# Patient Record
Sex: Female | Born: 1985 | Race: White | Hispanic: No | Smoking: Current every day smoker
Health system: Southern US, Community
[De-identification: ages and names within clinical notes are randomized; demographics above are authoritative.]

## PROBLEM LIST (undated history)

## (undated) DIAGNOSIS — J45909 Unspecified asthma, uncomplicated: Secondary | ICD-10-CM

## (undated) DIAGNOSIS — N200 Calculus of kidney: Secondary | ICD-10-CM

## (undated) DIAGNOSIS — Z87442 Personal history of urinary calculi: Secondary | ICD-10-CM

## (undated) DIAGNOSIS — E161 Other hypoglycemia: Secondary | ICD-10-CM

## (undated) DIAGNOSIS — Z8711 Personal history of peptic ulcer disease: Secondary | ICD-10-CM

## (undated) DIAGNOSIS — R51 Headache: Secondary | ICD-10-CM

## (undated) DIAGNOSIS — M1990 Unspecified osteoarthritis, unspecified site: Secondary | ICD-10-CM

## (undated) DIAGNOSIS — R519 Headache, unspecified: Secondary | ICD-10-CM

## (undated) DIAGNOSIS — Z8719 Personal history of other diseases of the digestive system: Secondary | ICD-10-CM

## (undated) DIAGNOSIS — F319 Bipolar disorder, unspecified: Secondary | ICD-10-CM

## (undated) DIAGNOSIS — K219 Gastro-esophageal reflux disease without esophagitis: Secondary | ICD-10-CM

## (undated) HISTORY — PX: COLONOSCOPY: SHX174

## (undated) HISTORY — PX: ESOPHAGOGASTRODUODENOSCOPY: SHX1529

## (undated) HISTORY — PX: OTHER SURGICAL HISTORY: SHX169

## (undated) HISTORY — PX: CHOLECYSTECTOMY: SHX55

## (undated) HISTORY — PX: ABDOMINAL HYSTERECTOMY: SHX81

## (undated) HISTORY — PX: MOUTH SURGERY: SHX715

---

## 2015-01-25 ENCOUNTER — Emergency Department: Payer: Self-pay | Admitting: Emergency Medicine

## 2015-02-07 DIAGNOSIS — F319 Bipolar disorder, unspecified: Secondary | ICD-10-CM | POA: Insufficient documentation

## 2015-02-07 DIAGNOSIS — F17200 Nicotine dependence, unspecified, uncomplicated: Secondary | ICD-10-CM | POA: Insufficient documentation

## 2015-03-19 ENCOUNTER — Emergency Department
Admit: 2015-03-19 | Disposition: A | Discharge status: Home / Self Care | Payer: Self-pay | Admitting: Emergency Medicine

## 2015-05-09 DIAGNOSIS — G43909 Migraine, unspecified, not intractable, without status migrainosus: Secondary | ICD-10-CM | POA: Insufficient documentation

## 2015-08-03 DIAGNOSIS — K219 Gastro-esophageal reflux disease without esophagitis: Secondary | ICD-10-CM | POA: Insufficient documentation

## 2015-08-17 DIAGNOSIS — Z8711 Personal history of peptic ulcer disease: Secondary | ICD-10-CM | POA: Insufficient documentation

## 2015-11-09 ENCOUNTER — Encounter: Payer: Self-pay | Admitting: *Deleted

## 2015-11-09 ENCOUNTER — Emergency Department: Payer: BLUE CROSS/BLUE SHIELD

## 2015-11-09 ENCOUNTER — Emergency Department
Admission: EM | Admit: 2015-11-09 | Discharge: 2015-11-09 | Disposition: A | Discharge status: Home / Self Care | Payer: BLUE CROSS/BLUE SHIELD | Attending: Emergency Medicine | Admitting: Emergency Medicine

## 2015-11-09 DIAGNOSIS — R109 Unspecified abdominal pain: Secondary | ICD-10-CM | POA: Diagnosis present

## 2015-11-09 DIAGNOSIS — Z3202 Encounter for pregnancy test, result negative: Secondary | ICD-10-CM | POA: Diagnosis not present

## 2015-11-09 DIAGNOSIS — N368 Other specified disorders of urethra: Secondary | ICD-10-CM | POA: Diagnosis not present

## 2015-11-09 DIAGNOSIS — F17200 Nicotine dependence, unspecified, uncomplicated: Secondary | ICD-10-CM | POA: Insufficient documentation

## 2015-11-09 DIAGNOSIS — N23 Unspecified renal colic: Secondary | ICD-10-CM

## 2015-11-09 DIAGNOSIS — Z880 Allergy status to penicillin: Secondary | ICD-10-CM | POA: Insufficient documentation

## 2015-11-09 HISTORY — DX: Calculus of kidney: N20.0

## 2015-11-09 LAB — COMPREHENSIVE METABOLIC PANEL
ALK PHOS: 69 U/L (ref 38–126)
ALT: 28 U/L (ref 14–54)
AST: 23 U/L (ref 15–41)
Albumin: 4.4 g/dL (ref 3.5–5.0)
Anion gap: 7 (ref 5–15)
BUN: 6 mg/dL (ref 6–20)
CALCIUM: 9.3 mg/dL (ref 8.9–10.3)
CHLORIDE: 105 mmol/L (ref 101–111)
CO2: 27 mmol/L (ref 22–32)
CREATININE: 0.75 mg/dL (ref 0.44–1.00)
GFR calc Af Amer: >60 mL/min (ref >60)
GFR calc non Af Amer: >60 mL/min (ref >60)
GLUCOSE: 100 mg/dL — AB (ref 65–99)
Potassium: 3.7 mmol/L (ref 3.5–5.1)
Sodium: 139 mmol/L (ref 135–145)
Total Bilirubin: 0.3 mg/dL (ref 0.3–1.2)
Total Protein: 7.6 g/dL (ref 6.5–8.1)

## 2015-11-09 LAB — CBC WITH DIFFERENTIAL/PLATELET
Basophils Absolute: 0.1 10*3/uL (ref 0–0.1)
Basophils Relative: 1 %
EOS ABS: 0.2 10*3/uL (ref 0–0.7)
EOS PCT: 2 %
HCT: 42 % (ref 35.0–47.0)
Hemoglobin: 14 g/dL (ref 12.0–16.0)
LYMPHS ABS: 3.2 10*3/uL (ref 1.0–3.6)
Lymphocytes Relative: 25 %
MCH: 31.8 pg (ref 26.0–34.0)
MCHC: 33.3 g/dL (ref 32.0–36.0)
MCV: 95.3 fL (ref 80.0–100.0)
MONO ABS: 0.7 10*3/uL (ref 0.2–0.9)
Monocytes Relative: 6 %
Neutro Abs: 8.6 10*3/uL — ABNORMAL HIGH (ref 1.4–6.5)
Neutrophils Relative %: 66 %
PLATELETS: 331 10*3/uL (ref 150–440)
RBC: 4.4 MIL/uL (ref 3.80–5.20)
RDW: 14.4 % (ref 11.5–14.5)
WBC: 12.7 10*3/uL — ABNORMAL HIGH (ref 3.6–11.0)

## 2015-11-09 LAB — LIPASE, BLOOD: Lipase: 19 U/L (ref 11–51)

## 2015-11-09 LAB — URINALYSIS COMPLETE WITH MICROSCOPIC (ARMC ONLY)
BACTERIA UA: NONE SEEN
BILIRUBIN URINE: NEGATIVE
Glucose, UA: NEGATIVE mg/dL
Ketones, ur: NEGATIVE mg/dL
LEUKOCYTES UA: NEGATIVE
Nitrite: NEGATIVE
PH: 7 (ref 5.0–8.0)
Protein, ur: NEGATIVE mg/dL
Specific Gravity, Urine: 1.002 — ABNORMAL LOW (ref 1.005–1.030)
WBC, UA: NONE SEEN WBC/hpf (ref 0–5)

## 2015-11-09 LAB — POCT PREGNANCY, URINE: Preg Test, Ur: NEGATIVE

## 2015-11-09 MED ORDER — OXYCODONE-ACETAMINOPHEN 5-325 MG PO TABS: 1.0000 | ORAL_TABLET | ORAL | Status: DC | PRN

## 2015-11-09 MED ORDER — ONDANSETRON HCL 4 MG/2ML IJ SOLN
4.0000 mg | Freq: Once | INTRAMUSCULAR | Status: AC
  Administered 2015-11-09: 4 mg via INTRAVENOUS
  Filled 2015-11-09: qty 2

## 2015-11-09 MED ORDER — TAMSULOSIN HCL 0.4 MG PO CAPS: 0.4000 mg | ORAL_CAPSULE | Freq: Every day | ORAL | Status: DC

## 2015-11-09 MED ORDER — KETOROLAC TROMETHAMINE 30 MG/ML IJ SOLN
30.0000 mg | Freq: Once | INTRAMUSCULAR | Status: AC
  Administered 2015-11-09: 30 mg via INTRAVENOUS
  Filled 2015-11-09: qty 1

## 2015-11-09 MED ORDER — SODIUM CHLORIDE 0.9 % IV BOLUS (SEPSIS)
1000.0000 mL | Freq: Once | INTRAVENOUS | Status: AC
  Administered 2015-11-09: 1000 mL via INTRAVENOUS

## 2015-11-09 MED ORDER — ONDANSETRON HCL 4 MG PO TABS: 4.0000 mg | ORAL_TABLET | Freq: Three times a day (TID) | ORAL | Status: DC | PRN

## 2015-11-09 MED ORDER — HYDROMORPHONE HCL 1 MG/ML IJ SOLN: 1.0000 mg | Freq: Once | INTRAMUSCULAR | Status: DC

## 2015-11-09 MED ORDER — KETOROLAC TROMETHAMINE 10 MG PO TABS: 10.0000 mg | ORAL_TABLET | Freq: Three times a day (TID) | ORAL | Status: DC | PRN

## 2015-11-09 NOTE — ED Notes (Signed)
Visitor noted to be standing in the hallway states they are ready to go home, this RN informed them i would be in as soon as possible to take care of them but it would be a few minutes, asked visitor to step back into the room for privacy reasons. Visitor then took one step back but remained leaning into the hall again asked pt to please step back into the room due to keep pts information confidential. Visitor looks at feet and says he is technically not in the hall and that we should not discuss pts information in the hall. Visitor was informed that if he was unable to step back into the room security would need to be called. Visitor finally did back into the room. Charge Nurse called to please assist with situation.

## 2015-11-09 NOTE — ED Notes (Signed)
Patient transported to Ultrasound 

## 2015-11-09 NOTE — ED Provider Notes (Signed)
[facility] Emergency Department Provider Note   ____________________________________________  Time seen: 3:50 PM I have reviewed the triage vital signs and the triage nursing note.  HISTORY  Chief Complaint Flank Pain   Historian Patient  HPI Olivia Walter is a 29 y.o. female with a history of prior kidney stones, passed the last one about 3 years ago, has had left flank pain for a day or 2 that wraps around to the left abdomen, worse today. Pain at worst is 10 out of 10, currently is 8 out of 10. Feels sharp. She's had some bloody urine. She's had some frequency of urination. She's been drinking cranberry juice since yesterday. No fever. Some nausea without vomiting or diarrhea. No exacerbating or alleviating factors.   Past Medical History  Diagnosis Date  . Kidney calculus     There are no active problems to display for this patient.   History reviewed. No pertinent past surgical history.  Current Outpatient Rx  Name  Route  Sig  Dispense  Refill  . ketorolac (TORADOL) 10 MG tablet   Oral   Take 1 tablet (10 mg total) by mouth every 8 (eight) hours as needed for moderate pain.   15 tablet   0   . ondansetron (ZOFRAN) 4 MG tablet   Oral   Take 1 tablet (4 mg total) by mouth every 8 (eight) hours as needed for nausea or vomiting.   10 tablet   0   . oxyCODONE-acetaminophen (ROXICET) 5-325 MG tablet   Oral   Take 1 tablet by mouth every 4 (four) hours as needed for severe pain.   10 tablet   0   . tamsulosin (FLOMAX) 0.4 MG CAPS capsule   Oral   Take 1 capsule (0.4 mg total) by mouth daily.   7 capsule   0     Allergies Bactrim; Clindamycin/lincomycin; and Penicillins  History reviewed. No pertinent family history.  Social History Social History  Substance Use Topics  . Smoking status: Current Every Day Smoker  . Smokeless tobacco: None  . Alcohol Use: None    Review of Systems  Constitutional: Negative for  fever. Eyes: Negative for visual changes. ENT: Negative for sore throat. Cardiovascular: Negative for chest pain. Respiratory: Negative for shortness of breath. Gastrointestinal: Negative for vomiting and diarrhea. Genitourinary: Mild dysuria. No pelvic discharge, pelvic pain, or irregular vaginal bleeding. Musculoskeletal: Negative for back pain. Skin: Negative for rash. Neurological: Negative for headache. 10 point Review of Systems otherwise negative ____________________________________________   PHYSICAL EXAM:  VITAL SIGNS: ED Triage Vitals  Enc Vitals Group     BP 11/09/15 1544 114/72 mmHg     Pulse Rate 11/09/15 1544 93     Resp 11/09/15 1544 18     Temp 11/09/15 1544 98.1 F (36.7 C)     Temp Source 11/09/15 1544 Oral     SpO2 11/09/15 1544 99 %     Weight 11/09/15 1544 160 lb (72.576 kg)     Height 11/09/15 1544  (1.6 m)     Head Cir --      Peak Flow --      Pain Score 11/09/15 1544 8     Pain Loc --      Pain Edu? --      Excl. in GC? --      Constitutional: Alert and oriented. Well appearing and in no distress. Eyes: Conjunctivae are normal. PERRL. Normal extraocular movements. ENT   Head: Normocephalic and  atraumatic.   Nose: No congestion/rhinnorhea.   Mouth/Throat: Mucous membranes are moist.   Neck: No stridor. Cardiovascular/Chest: Normal rate, regular rhythm.  No murmurs, rubs, or gallops. Respiratory: Normal respiratory effort without tachypnea nor retractions. Breath sounds are clear and equal bilaterally. No wheezes/rales/rhonchi. Gastrointestinal: Soft. No distention, no guarding, no rebound. Nontender   Genitourinary/rectal:Deferred Musculoskeletal: Nontender with normal range of motion in all extremities. No joint effusions.  No lower extremity tenderness.  No edema. Neurologic:  Normal speech and language. No gross or focal neurologic deficits are appreciated. Skin:  Skin is warm, dry and intact. No rash noted. Psychiatric:  Mood and affect are normal. Speech and behavior are normal. Patient exhibits appropriate insight and judgment.  ____________________________________________   EKG I, Governor Rooksebecca Mekala Winger, MD, the attending physician have personally viewed and interpreted all ECGs.  None ____________________________________________  LABS (pertinent positives/negatives)  Urine pregnancy test negative Comprehensive metabolic panel without significant abnormalities White blood cell count 12.7 Urinalysis hemoglobin urine dipstick 1+, 0-5 red blood cells and otherwise negative  ____________________________________________  RADIOLOGY All Xrays were viewed by me. Imaging interpreted by Radiologist.  Renal ultrasound: Negative __________________________________________  PROCEDURES  Procedure(s) performed: None  Critical Care performed: None  ____________________________________________   ED COURSE / ASSESSMENT AND PLAN  CONSULTATIONS: None  Pertinent labs & imaging results that were available during my care of the patient were reviewed by me and considered in my medical decision making (see chart for details).  Suspect nephrolithiasis based on clinical history. Initiated symptomatic treatment. Discussed with the patient that we will try to avoid CT scan due to radiation risk.  Minimal urine blood, but certainly no evidence of infection. Renal ultrasound shows no evidence of obstruction. Renal function is normal. She does have a minimally elevated white blood cell count, however patient does occur symptoms are due to any stone, and she's not having other symptoms to make me highly concerned for another intra-abdominal source of her left flank pain with hematuria. She is going be treated presumptively for nephrolithiasis.    Patient / Family / Caregiver informed of clinical course, medical decision-making process, and agree with plan.   I discussed return precautions, follow-up instructions, and  discharged instructions with patient and/or family.  ___________________________________________   FINAL CLINICAL IMPRESSION(S) / ED DIAGNOSES   Final diagnoses:  Ureteral colic       Governor Rooksebecca Sandra Tellefsen, MD 11/09/15 [phone]

## 2015-11-09 NOTE — ED Notes (Signed)
Pt states she has a hx of kidney stones. Started to experience pain about a week ago. Currently rates pain at 9.

## 2015-11-09 NOTE — Discharge Instructions (Signed)
You were evaluated for left flank pain, and based on your symptoms, it sounds like you are having pain associated with passing a kidney stone. He did examine evaluation are reassuring. We discussed your White blood cell count was slightly elevated, and we discussed holding off on CT scan at this point in time and treating for presumed kidney stone.  Return to the emergency department for any worsening condition including any worsening pain, inability to urinate, fever, black or bloody stools, vomiting, or any other symptoms concerning to you.   Renal Colic Renal colic is pain that is caused by passing a kidney stone. The pain can be sharp and severe. It may be felt in the back, abdomen, side (flank), or groin. It can cause nausea. Renal colic can come and go. HOME CARE INSTRUCTIONS Watch your condition for any changes. The following actions may help to lessen any discomfort that you are feeling:  Take medicines only as directed by your health care provider.  Ask your health care provider if it is okay to take over-the-counter pain medicine.  Drink enough fluid to keep your urine clear or pale yellow. Drink 6-8 glasses of water each day.  Limit the amount of salt that you eat to less than 2 grams per day.  Reduce the amount of protein in your diet. Eat less meat, fish, nuts, and dairy.  Avoid foods such as spinach, rhubarb, nuts, or bran. These may make kidney stones more likely to form. SEEK MEDICAL CARE IF:  You have a fever or chills.  Your urine smells bad or looks cloudy.  You have pain or burning when you pass urine. SEEK IMMEDIATE MEDICAL CARE IF:  Your flank pain or groin pain suddenly worsens.  You become confused or disoriented or you lose consciousness.   This information is not intended to replace advice given to you by your health care provider. Make sure you discuss any questions you have with your health care provider.   Document Released: 08/22/2005 Document  Revised: 12/03/2014 Document Reviewed: 09/22/2014 Elsevier Interactive Patient Education [employer]2016 [employer] Inc.

## 2015-11-09 NOTE — ED Notes (Signed)
Visitor in hall upset; requesting to leave. [facility] orders in for medications; orders have not been carried. Visitor states, "We just want to go." MD aware and is ok with patient leaving prior to medication administration.

## 2015-11-09 NOTE — ED Notes (Signed)
Charge nurse called to room to speak with patient visitor who was upset regarding discharge. Visitor reporting that they had been waiting "for over an hour" for medications and discharge. Of note, primary nurse tied up in another room with a critical patient. MD asked this RN to discharge patient; delayed as chart was not in discharge rack - primary nurse had chart. RN to room; PIV removed. Charge nurse apologized for delay. Visitor states, "She has medications on there that she did not receive. I do not want them showing up on her insurance." This RN explained to patient and visitor that they would not be charged for any services or medications that they did not receive tonight. Visitor asking for primary nurse's name prior to leaving ED. Patient discharge and follow up information reviewed with patient by ED nursing staff and patient given the opportunity to ask questions pertaining to ED visit and discharge plan of care. Patient advised that should symptoms not continue to improve, resolve entirely, or should new symptoms develop then a follow up visit with their PCP or a return visit to the ED may be warranted. Patient verbalized consent and understanding of discharge plan of care including potential need for further evaluation. Patient being discharged in stable condition per attending ED physician on duty.

## 2015-11-09 NOTE — ED Notes (Signed)
Pt states left sided flank pain, states some blood in her urine, states hx of kidney stones

## 2016-06-05 ENCOUNTER — Encounter: Payer: Self-pay | Admitting: Emergency Medicine

## 2016-06-05 ENCOUNTER — Emergency Department: Payer: BLUE CROSS/BLUE SHIELD

## 2016-06-05 ENCOUNTER — Emergency Department
Admission: EM | Admit: 2016-06-05 | Discharge: 2016-06-05 | Disposition: A | Discharge status: Home / Self Care | Payer: BLUE CROSS/BLUE SHIELD | Attending: Emergency Medicine | Admitting: Emergency Medicine

## 2016-06-05 DIAGNOSIS — F17200 Nicotine dependence, unspecified, uncomplicated: Secondary | ICD-10-CM | POA: Diagnosis not present

## 2016-06-05 DIAGNOSIS — Z791 Long term (current) use of non-steroidal anti-inflammatories (NSAID): Secondary | ICD-10-CM | POA: Insufficient documentation

## 2016-06-05 DIAGNOSIS — R112 Nausea with vomiting, unspecified: Secondary | ICD-10-CM | POA: Diagnosis not present

## 2016-06-05 DIAGNOSIS — J45909 Unspecified asthma, uncomplicated: Secondary | ICD-10-CM | POA: Insufficient documentation

## 2016-06-05 DIAGNOSIS — Z79899 Other long term (current) drug therapy: Secondary | ICD-10-CM | POA: Insufficient documentation

## 2016-06-05 DIAGNOSIS — R1012 Left upper quadrant pain: Secondary | ICD-10-CM | POA: Diagnosis present

## 2016-06-05 DIAGNOSIS — E876 Hypokalemia: Secondary | ICD-10-CM | POA: Insufficient documentation

## 2016-06-05 HISTORY — DX: Unspecified asthma, uncomplicated: J45.909

## 2016-06-05 LAB — COMPREHENSIVE METABOLIC PANEL
ALBUMIN: 4.4 g/dL (ref 3.5–5.0)
ALK PHOS: 70 U/L (ref 38–126)
ALT: 54 U/L (ref 14–54)
ANION GAP: 10 (ref 5–15)
AST: 32 U/L (ref 15–41)
BUN: 6 mg/dL (ref 6–20)
CALCIUM: 9.1 mg/dL (ref 8.9–10.3)
CO2: 23 mmol/L (ref 22–32)
Chloride: 104 mmol/L (ref 101–111)
Creatinine, Ser: 0.71 mg/dL (ref 0.44–1.00)
GFR calc non Af Amer: >60 mL/min (ref >60)
GLUCOSE: 102 mg/dL — AB (ref 65–99)
POTASSIUM: 2.9 mmol/L — AB (ref 3.5–5.1)
Sodium: 137 mmol/L (ref 135–145)
TOTAL PROTEIN: 7.4 g/dL (ref 6.5–8.1)
Total Bilirubin: 0.3 mg/dL (ref 0.3–1.2)

## 2016-06-05 LAB — URINALYSIS COMPLETE WITH MICROSCOPIC (ARMC ONLY)
BACTERIA UA: NONE SEEN
Bilirubin Urine: NEGATIVE
Glucose, UA: NEGATIVE mg/dL
KETONES UR: NEGATIVE mg/dL
LEUKOCYTES UA: NEGATIVE
NITRITE: NEGATIVE
PROTEIN: NEGATIVE mg/dL
SPECIFIC GRAVITY, URINE: 1.003 — AB (ref 1.005–1.030)
pH: 7 (ref 5.0–8.0)

## 2016-06-05 LAB — LIPASE, BLOOD: Lipase: 17 U/L (ref 11–51)

## 2016-06-05 LAB — CBC
HEMATOCRIT: 43.4 % (ref 35.0–47.0)
HEMOGLOBIN: 15.2 g/dL (ref 12.0–16.0)
MCH: 32.9 pg (ref 26.0–34.0)
MCHC: 35 g/dL (ref 32.0–36.0)
MCV: 94 fL (ref 80.0–100.0)
Platelets: 381 10*3/uL (ref 150–440)
RBC: 4.62 MIL/uL (ref 3.80–5.20)
RDW: 14.4 % (ref 11.5–14.5)
WBC: 9.8 10*3/uL (ref 3.6–11.0)

## 2016-06-05 LAB — PREGNANCY, URINE: PREG TEST UR: NEGATIVE

## 2016-06-05 MED ORDER — IOPAMIDOL (ISOVUE-300) INJECTION 61%
100.0000 mL | Freq: Once | INTRAVENOUS | Status: AC | PRN
  Administered 2016-06-05: 100 mL via INTRAVENOUS
  Filled 2016-06-05: qty 100

## 2016-06-05 MED ORDER — FENTANYL CITRATE (PF) 100 MCG/2ML IJ SOLN
50.0000 ug | Freq: Once | INTRAMUSCULAR | Status: AC
  Administered 2016-06-05: 50 ug via INTRAVENOUS
  Filled 2016-06-05: qty 2

## 2016-06-05 MED ORDER — POTASSIUM CHLORIDE ER 20 MEQ PO TBCR: 40.0000 meq | EXTENDED_RELEASE_TABLET | Freq: Every day | ORAL | Status: DC

## 2016-06-05 MED ORDER — ONDANSETRON HCL 4 MG/2ML IJ SOLN
4.0000 mg | Freq: Once | INTRAMUSCULAR | Status: AC
  Administered 2016-06-05: 4 mg via INTRAVENOUS
  Filled 2016-06-05: qty 2

## 2016-06-05 MED ORDER — ONDANSETRON 4 MG PO TBDP: 4.0000 mg | ORAL_TABLET | Freq: Three times a day (TID) | ORAL | Status: DC | PRN

## 2016-06-05 MED ORDER — DIATRIZOATE MEGLUMINE & SODIUM 66-10 % PO SOLN
15.0000 mL | Freq: Once | ORAL | Status: AC
  Administered 2016-06-05: 15 mL via ORAL

## 2016-06-05 MED ORDER — POTASSIUM CHLORIDE CRYS ER 20 MEQ PO TBCR
40.0000 meq | EXTENDED_RELEASE_TABLET | Freq: Once | ORAL | Status: AC
  Administered 2016-06-05: 40 meq via ORAL
  Filled 2016-06-05 (×2): qty 2

## 2016-06-05 MED ORDER — ACETAMINOPHEN 500 MG PO TABS
ORAL_TABLET | ORAL | Status: AC
  Administered 2016-06-05: 1000 mg via ORAL
  Filled 2016-06-05: qty 2

## 2016-06-05 MED ORDER — SODIUM CHLORIDE 0.9 % IV BOLUS (SEPSIS)
1000.0000 mL | Freq: Once | INTRAVENOUS | Status: AC
  Administered 2016-06-05: 1000 mL via INTRAVENOUS

## 2016-06-05 MED ORDER — OMEPRAZOLE 40 MG PO CPDR: 40.0000 mg | DELAYED_RELEASE_CAPSULE | Freq: Every day | ORAL | Status: DC

## 2016-06-05 MED ORDER — ACETAMINOPHEN 500 MG PO TABS
1000.0000 mg | ORAL_TABLET | Freq: Once | ORAL | Status: AC
  Administered 2016-06-05: 1000 mg via ORAL

## 2016-06-05 MED ORDER — GI COCKTAIL ~~LOC~~
30.0000 mL | Freq: Once | ORAL | Status: AC
  Administered 2016-06-05: 30 mL via ORAL
  Filled 2016-06-05: qty 30

## 2016-06-05 NOTE — Discharge Instructions (Signed)
It is possible that your nausea and abdominal pain may be from a peptic ulcer.  Please start the omeprazole today, follow the recommended diet, and stay away from alcohol and NSAID medications (Alleve, Ibuprofen, Advil, Motrin).  Return to the emergency department for severe pain, inability to keep down liquids, fever, or any other symptoms concerning to you.

## 2016-06-05 NOTE — ED Notes (Signed)
Talked to Shay from the lab and got her to add on a urine pregnancy to the urine that was already down in the lab.

## 2016-06-05 NOTE — ED Notes (Signed)
Pt with left upper quadrant pain for two weeks worse the past two days.

## 2016-06-05 NOTE — ED Provider Notes (Addendum)
[facility] Emergency Department Provider Note  ____________________________________________  Time seen: Approximately 2:31 PM  I have reviewed the triage vital signs and the nursing notes.   HISTORY  Chief Complaint Abdominal Pain    HPI Olivia Walter is a [age] y.o. female presenting with 2 weeks of left upper quadrant pain, nausea and vomiting, and mild diarrhea. The patient reports that for the past 2 weeks she has had a left upper quadrant "stabbing" sensation every time she eats. This is not worse with positional changes, it is not worse with deep breaths or coughing, and it has not been associated with any chest pain or shortness of breath. Since yesterday, the pain has become more constant, and now she is unable to tolerate anything by mouth due to pain. She had 1 episode of vomiting 2 days ago. She denies any fever, chills, urinary symptoms. She has had several episodes of loose stool that is nonbloody.The patient does take daily Motrin.   Past Medical History  Diagnosis Date  . Kidney calculus   . Asthma     There are no active problems to display for this patient.   History reviewed. No pertinent past surgical history.  Current Outpatient Rx  Name  Route  Sig  Dispense  Refill  . DEXILANT 60 MG capsule   Oral   Take 1 capsule by mouth daily.           Dispense as written.   . meloxicam (MOBIC) 15 MG tablet   Oral   Take 1 tablet by mouth 2 (two) times daily.         . tamsulosin (FLOMAX) 0.4 MG CAPS capsule   Oral   Take 1 capsule (0.4 mg total) by mouth daily.   7 capsule   0   . omeprazole (PRILOSEC) 40 MG capsule   Oral   Take 1 capsule (40 mg total) by mouth daily.   30 capsule   0   . ondansetron (ZOFRAN ODT) 4 MG disintegrating tablet   Oral   Take 1 tablet (4 mg total) by mouth every 8 (eight) hours as needed for nausea or vomiting.   20 tablet   0   . potassium chloride 20 MEQ TBCR   Oral   Take 40 mEq by mouth  daily.   6 tablet   0     Allergies Bactrim; Clindamycin/lincomycin; and Penicillins  No family history on file.  Social History Social History  Substance Use Topics  . Smoking status: Current Every Day Smoker  . Smokeless tobacco: None  . Alcohol Use: None    Review of Systems Constitutional: No fever/chills.No lightheadedness or syncope. Eyes: No visual changes. ENT: No sore throat. No congestion or rhinorrhea. Cardiovascular: Denies chest pain. Denies palpitations. Respiratory: Denies shortness of breath.  No cough. Gastrointestinal: Positive left upper quadrant abdominal pain .  Positive nausea, positive vomiting.  Positive diarrhea.  No constipation. Genitourinary: Negative for dysuria. Musculoskeletal: Negative for back pain. Skin: Negative for rash. Neurological: Negative for headaches. No focal numbness, tingling or weakness.   10-point ROS otherwise negative.  ____________________________________________   PHYSICAL EXAM:  VITAL SIGNS: ED Triage Vitals  Enc Vitals Group     BP 06/05/16 1255 117/76 mmHg     Pulse Rate 06/05/16 1255 106     Resp 06/05/16 1255 20     Temp 06/05/16 1255 97.7 F (36.5 C)     Temp Source 06/05/16 1255 Oral     SpO2 06/05/16  1255 97 %     Weight --      Height --      Head Cir --      Peak Flow --      Pain Score 06/05/16 1256 10     Pain Loc --      Pain Edu? --      Excl. in GC? --     Constitutional: Alert and oriented. Uncomfortable appearing but in no acute distress. Answers questions appropriately. Eyes: Conjunctivae are normal.  EOMI. No scleral icterus. Head: Atraumatic. Nose: No congestion/rhinnorhea. Mouth/Throat: Mucous membranes are moist.  Neck: No stridor.  Supple.  No meningismus. Cardiovascular: Normal rate, regular rhythm. No murmurs, rubs or gallops.  Respiratory: Normal respiratory effort.  No accessory muscle use or retractions. Lungs CTAB.  No wheezes, rales or ronchi. Gastrointestinal: Soft and  nondistended.  Tender to palpation in the left upper quadrant. No guarding or rebound.  No peritoneal signs. Musculoskeletal: No LE edema.  Neurologic:  A&Ox3.  Speech is clear.  Face and smile are symmetric.  EOMI.  Moves all extremities well. Skin:  Skin is warm, dry and intact. No rash noted. Psychiatric: Mood and affect are normal. Speech and behavior are normal.  Normal judgement.  ____________________________________________   LABS (all labs ordered are listed, but only abnormal results are displayed)  Labs Reviewed  COMPREHENSIVE METABOLIC PANEL - Abnormal; Notable for the following:    Potassium 2.9 (*)    Glucose, Bld 102 (*)    All other components within normal limits  URINALYSIS COMPLETEWITH MICROSCOPIC ([facility] ONLY) - Abnormal; Notable for the following:    Color, Urine STRAW (*)    APPearance CLEAR (*)    Specific Gravity, Urine 1.003 (*)    Hgb urine dipstick 1+ (*)    Squamous Epithelial / LPF 0-5 (*)    All other components within normal limits  LIPASE, BLOOD  CBC  PREGNANCY, URINE   ____________________________________________  EKG  ED ECG REPORT I, Rockne Menghini, the attending physician, personally viewed and interpreted this ECG.   Date: 06/05/2016  EKG Time: 1448  Rate: 85  Rhythm: normal sinus rhythm  Axis: normal  Intervals:none  ST&T Change: nonspecific twave inversion in V1, no ST elevation  ____________________________________________  RADIOLOGY  Ct Abdomen Pelvis W Contrast  06/05/2016  CLINICAL DATA:  Left upper quadrant pain for 2 weeks EXAM: CT ABDOMEN AND PELVIS WITH CONTRAST TECHNIQUE: Multidetector CT imaging of the abdomen and pelvis was performed using the standard protocol following bolus administration of intravenous contrast. CONTRAST:  ISOVUE-300 IOPAMIDOL (ISOVUE-300) INJECTION 61% COMPARISON:  None. FINDINGS: Lower chest:  The lung bases are unremarkable. Hepatobiliary: Mild fatty infiltration of the liver. No focal  hepatic mass. No calcified gallstones are noted within gallbladder. Pancreas: No mass, inflammatory changes, or other significant abnormality. Spleen: Within normal limits in size and appearance. Adrenals/Urinary Tract: No adrenal gland mass. Enhanced kidneys are symmetrical in size. No hydronephrosis or hydroureter. The urinary bladder is unremarkable. Stomach/Bowel: Oral contrast material was given to the patient. No gastric outlet obstruction. No thickened or dilated small bowel loops. No small bowel obstruction. There is no pericecal inflammation. The terminal ileum is unremarkable. Normal appendix. No evidence of colitis or diverticulitis. No colonic obstruction. Contrast material noted within distal sigmoid colon. Vascular/Lymphatic: No aortic aneurysm. No retroperitoneal or mesenteric adenopathy. Reproductive: The uterus and ovaries are unremarkable. No adnexal mass. The uterus is normal size anteflexed. Other: No ascites or free abdominal air. There is no  inguinal adenopathy. Musculoskeletal: No destructive bony lesions are noted. Sagittal images of the spine are unremarkable. No acute fractures are noted. IMPRESSION: 1. There is no evidence of acute inflammatory process within abdomen. 2. Mild fatty infiltration of the liver. 3. No hydronephrosis or hydroureter. 4. Normal appendix.  No pericecal inflammation. 5. No small bowel obstruction. 6. No evidence of colitis or diverticulitis.  No adnexal mass. Electronically Signed   By: Natasha Mead M.D.   On: 06/05/2016 16:06    ____________________________________________   PROCEDURES  Procedure(s) performed: None  Critical Care performed: No ____________________________________________   INITIAL IMPRESSION / ASSESSMENT AND PLAN / ED COURSE  Pertinent labs & imaging results that were available during my care of the patient were reviewed by me and considered in my medical decision making (see chart for details).  30 y.o. female with progressively  worsening left upper quadrant pain, exacerbated by eating, with nausea and vomiting, mild diarrhea. Overall, the patient is uncomfortable appearing and does have tenderness to palpation in the left upper quadrant. Her symptoms are concerning for peptic or gastric ulcer, symptomatic hiatal hernia. We will also get a CT scan to rule out diverticulitis or colitis. The patient has some mild hypokalemia which will supplement. Will initiate symptomatically treatment and reevaluate the patient after her diagnostic workup.  ----------------------------------------- 4:21 PM on 06/05/2016 -----------------------------------------  The patient's work up is reassuring.  Her CT scan does not show any acute abnormalities.  Her labs are reassuring and she has been supplemented for her hypokalemia.  We will plan to initiate treatment for presumptive PUD and have the patient follow up with her PMD.  Return precautions as well as follow up instructions were discussed.   ____________________________________________  FINAL CLINICAL IMPRESSION(S) / ED DIAGNOSES  Final diagnoses:  Hypokalemia  Abdominal pain, left upper quadrant  Non-intractable vomiting with nausea, vomiting of unspecified type      NEW MEDICATIONS STARTED DURING THIS VISIT:  New Prescriptions   OMEPRAZOLE (PRILOSEC) 40 MG CAPSULE    Take 1 capsule (40 mg total) by mouth daily.   ONDANSETRON (ZOFRAN ODT) 4 MG DISINTEGRATING TABLET    Take 1 tablet (4 mg total) by mouth every 8 (eight) hours as needed for nausea or vomiting.   POTASSIUM CHLORIDE 20 MEQ TBCR    Take 40 mEq by mouth daily.     Rockne Menghini, MD 06/05/16 1622  Rockne Menghini, MD 06/05/16 1610

## 2016-06-05 NOTE — ED Notes (Signed)
Pt states the GI cocktail numbed her throat and esophagus, but did not help the left upper quadrant pain.

## 2016-06-05 NOTE — ED Notes (Signed)
Pt given gingerale for PO challenge 

## 2016-06-05 NOTE — ED Notes (Addendum)
Pt to ED today with left upper quadrant pain that started approximately 2 weeks ago and has progressively gotten worse. Pt reports having history of kidney stones but says this feels different.

## 2016-06-05 NOTE — ED Notes (Signed)
Dr. Sharma CovertNorman signed and saw the EKG.

## 2016-06-12 DIAGNOSIS — K279 Peptic ulcer, site unspecified, unspecified as acute or chronic, without hemorrhage or perforation: Secondary | ICD-10-CM | POA: Insufficient documentation

## 2016-06-12 DIAGNOSIS — E876 Hypokalemia: Secondary | ICD-10-CM | POA: Diagnosis present

## 2016-10-01 ENCOUNTER — Ambulatory Visit: Payer: BLUE CROSS/BLUE SHIELD | Admitting: Anesthesiology

## 2016-10-01 ENCOUNTER — Ambulatory Visit
Admission: RE | Admit: 2016-10-01 | Discharge: 2016-10-01 | Disposition: A | Discharge status: Home / Self Care | Payer: BLUE CROSS/BLUE SHIELD | Source: Ambulatory Visit | Attending: Gastroenterology | Admitting: Gastroenterology

## 2016-10-01 ENCOUNTER — Encounter
Admission: RE | Disposition: A | Discharge status: Home / Self Care | Payer: Self-pay | Source: Ambulatory Visit | Attending: Gastroenterology

## 2016-10-01 ENCOUNTER — Encounter: Payer: Self-pay | Admitting: *Deleted

## 2016-10-01 DIAGNOSIS — Z793 Long term (current) use of hormonal contraceptives: Secondary | ICD-10-CM | POA: Diagnosis not present

## 2016-10-01 DIAGNOSIS — F17200 Nicotine dependence, unspecified, uncomplicated: Secondary | ICD-10-CM | POA: Diagnosis not present

## 2016-10-01 DIAGNOSIS — K3189 Other diseases of stomach and duodenum: Secondary | ICD-10-CM | POA: Insufficient documentation

## 2016-10-01 DIAGNOSIS — R1012 Left upper quadrant pain: Secondary | ICD-10-CM | POA: Insufficient documentation

## 2016-10-01 DIAGNOSIS — K210 Gastro-esophageal reflux disease with esophagitis: Secondary | ICD-10-CM | POA: Diagnosis not present

## 2016-10-01 DIAGNOSIS — Z79899 Other long term (current) drug therapy: Secondary | ICD-10-CM | POA: Insufficient documentation

## 2016-10-01 DIAGNOSIS — K228 Other specified diseases of esophagus: Secondary | ICD-10-CM | POA: Insufficient documentation

## 2016-10-01 DIAGNOSIS — K449 Diaphragmatic hernia without obstruction or gangrene: Secondary | ICD-10-CM | POA: Diagnosis not present

## 2016-10-01 HISTORY — PX: ESOPHAGOGASTRODUODENOSCOPY (EGD) WITH PROPOFOL: SHX5813

## 2016-10-01 HISTORY — DX: Calculus of kidney: N20.0

## 2016-10-01 HISTORY — DX: Other hypoglycemia: E16.1

## 2016-10-01 HISTORY — DX: Unspecified osteoarthritis, unspecified site: M19.90

## 2016-10-01 SURGERY — ESOPHAGOGASTRODUODENOSCOPY (EGD) WITH PROPOFOL
Anesthesia: General

## 2016-10-01 MED ORDER — MIDAZOLAM HCL 2 MG/2ML IJ SOLN
INTRAMUSCULAR | Status: DC | PRN
  Administered 2016-10-01: 1 mg via INTRAVENOUS

## 2016-10-01 MED ORDER — PROPOFOL 500 MG/50ML IV EMUL
INTRAVENOUS | Status: DC | PRN
  Administered 2016-10-01: 125 ug/kg/min via INTRAVENOUS

## 2016-10-01 MED ORDER — FENTANYL CITRATE (PF) 100 MCG/2ML IJ SOLN
INTRAMUSCULAR | Status: DC | PRN
  Administered 2016-10-01: 50 ug via INTRAVENOUS

## 2016-10-01 MED ORDER — SODIUM CHLORIDE 0.9 % IV SOLN
INTRAVENOUS | Status: DC
Start: 2016-10-01 — End: 2016-10-01
  Administered 2016-10-01: 1000 mL via INTRAVENOUS

## 2016-10-01 MED ORDER — SODIUM CHLORIDE 0.9 % IV SOLN: INTRAVENOUS | Status: DC

## 2016-10-01 MED ORDER — PROPOFOL 10 MG/ML IV BOLUS
INTRAVENOUS | Status: DC | PRN
  Administered 2016-10-01: 20 mg via INTRAVENOUS
  Administered 2016-10-01: 30 mg via INTRAVENOUS
  Administered 2016-10-01: 20 mg via INTRAVENOUS

## 2016-10-01 NOTE — H&P (Signed)
Outpatient short stay form Pre-procedure 10/01/2016 11:24 AM Olivia DeemMartin U Caroll Weinheimer MD  Primary Physician:  Dr Lanier EnsignMeredith Soles  Reason for visit:  EGD  History of present illness:  Patient is a 30 year old female presenting today with complaints of left upper quadrant discomfort. This should initially began several months ago. She been placed on initially single-dose PPI then twice a day PPI. She was taking regular NSAIDs and was told to avoid these. She continues take an occasional Excedrin indeed the last one was yesterday. Discussed with her that this does increase risk for bleeding. Traversing symptoms however have improved with the twice a day proton pump inhibitor. She takes no other blood thinning agents or aspirin products.    Current Facility-Administered Medications:  .  0.9 %  sodium chloride infusion, , Intravenous, Continuous, Olivia DeemMartin U Desiderio Dolata, MD, Last Rate: 20 mL/hr at 10/01/16 1105, 1,000 mL at 10/01/16 1105 .  0.9 %  sodium chloride infusion, , Intravenous, Continuous, Olivia DeemMartin U Anushri Casalino, MD  Prescriptions Prior to Admission  Medication Sig Dispense Refill Last Dose  . cyclobenzaprine (FLEXERIL) 10 MG tablet Take 10 mg by mouth as needed for muscle spasms.     . medroxyPROGESTERone (DEPO-PROVERA) 150 MG/ML injection Inject 150 mg into the muscle every 3 (three) months.     . Multiple Vitamins-Iron (MULTIVITAMINS WITH IRON) TABS tablet Take 1 tablet by mouth daily.     . [DISCONTINUED] ferrous fumarate-b12-vitamic C-folic acid (TRINSICON / FOLTRIN) capsule Take 1 capsule by mouth 3 (three) times daily after meals.     . [DISCONTINUED] Mag Aspart-Potassium Aspart (RA POTASSIUM/MAGNESIUM PO) Take by mouth daily.     . pantoprazole (PROTONIX) 40 MG tablet Take 40 mg by mouth 2 (two) times daily before a meal.   Completed Course at Unknown time  . [DISCONTINUED] DEXILANT 60 MG capsule Take 1 capsule by mouth daily.   Completed Course at Unknown time  . [DISCONTINUED] meloxicam (MOBIC)  15 MG tablet Take 1 tablet by mouth 2 (two) times daily.   Completed Course at Unknown time  . [DISCONTINUED] omeprazole (PRILOSEC) 40 MG capsule Take 1 capsule (40 mg total) by mouth daily. (Patient not taking: Reported on 09/28/2016) 30 capsule 0 Completed Course at Unknown time  . [DISCONTINUED] ondansetron (ZOFRAN ODT) 4 MG disintegrating tablet Take 1 tablet (4 mg total) by mouth every 8 (eight) hours as needed for nausea or vomiting. (Patient not taking: Reported on 09/28/2016) 20 tablet 0 Completed Course at Unknown time  . [DISCONTINUED] potassium chloride 20 MEQ TBCR Take 40 mEq by mouth daily. (Patient not taking: Reported on 09/28/2016) 6 tablet 0 Completed Course at Unknown time  . [DISCONTINUED] tamsulosin (FLOMAX) 0.4 MG CAPS capsule Take 1 capsule (0.4 mg total) by mouth daily. (Patient not taking: Reported on 09/28/2016) 7 capsule 0 Completed Course at Unknown time     Allergies  Allergen Reactions  . Bactrim [Sulfamethoxazole-Trimethoprim]   . Clindamycin/Lincomycin   . Penicillins      Past Medical History:  Diagnosis Date  . Asthma   . Kidney calculus   . Kidney stones   . Osteoarthritis   . Reactive hypoglycemia     Review of systems:      Physical Exam    Heart and lungs: Regular rate and rhythm without rub or gallop, lungs are bilaterally clear.    HEENT: Normocephalic atraumatic eyes are anicteric    Other:     Pertinant exam for procedure: Soft nontender nondistended bowel sounds positive normoactive.  Planned proceedures: EGD and indicated procedures. I have discussed the risks benefits and complications of procedures to include not limited to bleeding, infection, perforation and the risk of sedation and the patient wishes to proceed.    Olivia DeemMartin U Oliviya Gilkison, MD Gastroenterology 10/01/2016  11:24 AM

## 2016-10-01 NOTE — Anesthesia Preprocedure Evaluation (Signed)
Anesthesia Evaluation  Patient identified by MRN, date of birth, ID band Patient awake    Reviewed: Allergy & Precautions, NPO status , Patient's Chart, lab work & pertinent test results  History of Anesthesia Complications Negative for: history of anesthetic complications  Airway Mallampati: II       Dental  (+) Edentulous Upper   Pulmonary asthma (as a child) , Current Smoker,           Cardiovascular negative cardio ROS       Neuro/Psych negative neurological ROS     GI/Hepatic Neg liver ROS, GERD  Medicated and Poorly Controlled,  Endo/Other  negative endocrine ROS  Renal/GU Renal disease (stones)     Musculoskeletal  (+) Arthritis , Osteoarthritis,    Abdominal   Peds  Hematology negative hematology ROS (+)   Anesthesia Other Findings   Reproductive/Obstetrics                             Anesthesia Physical Anesthesia Plan  ASA: II  Anesthesia Plan: General   Post-op Pain Management:    Induction: Intravenous  Airway Management Planned:   Additional Equipment:   Intra-op Plan:   Post-operative Plan:   Informed Consent: I have reviewed the patients History and Physical, chart, labs and discussed the procedure including the risks, benefits and alternatives for the proposed anesthesia with the patient or authorized representative who has indicated his/her understanding and acceptance.     Plan Discussed with:   Anesthesia Plan Comments:         Anesthesia Quick Evaluation

## 2016-10-01 NOTE — Transfer of Care (Signed)
Immediate Anesthesia Transfer of Care Note  Patient: Olivia LeatherwoodMelanie Arons  Procedure(s) Performed: Procedure(s): ESOPHAGOGASTRODUODENOSCOPY (EGD) WITH PROPOFOL (N/A)  Patient Location: PACU  Anesthesia Type:General  Level of Consciousness: awake and alert   Airway & Oxygen Therapy: Patient Spontanous Breathing and Patient connected to nasal cannula oxygen  Post-op Assessment: Report given to RN and Post -op Vital signs reviewed and stable  Post vital signs: Reviewed and stable  Last Vitals:  Vitals:   10/01/16 1052  BP: (!) 101/56  Pulse: 95  Resp: 14  Temp: 36.7 C    Last Pain:  Vitals:   10/01/16 1052  TempSrc: Tympanic         Complications: No apparent anesthesia complications

## 2016-10-01 NOTE — Op Note (Signed)
[facility] Gastroenterology Patient Name: Olivia LeatherwoodMelanie Banton Procedure Date: 10/01/2016 11:16 AM MRN: [id number] Account #: [id number] Date of Birth: 08/31/1986 Admit Type: Outpatient Age: 30 Room: [facility] ENDO ROOM 1 Gender: Female Note Status: Finalized Procedure:            Upper GI endoscopy Indications:          Abdominal pain in the left upper quadrant Providers:            Christena DeemMartin U. Clydell Alberts, MD Medicines:            Monitored Anesthesia Care Complications:        No immediate complications. Procedure:            Pre-Anesthesia Assessment:                       - ASA Grade Assessment: II - A patient with mild                        systemic disease.                       After obtaining informed consent, the endoscope was                        passed under direct vision. Throughout the procedure,                        the patient's blood pressure, pulse, and oxygen                        saturations were monitored continuously. The Endoscope                        was introduced through the mouth, and advanced to the                        third part of duodenum. The upper GI endoscopy was                        accomplished without difficulty. The patient tolerated                        the procedure well. Findings:      The Z-line was irregular. Biopsies were taken with a cold forceps for       histology.      A small hiatal hernia was found. The Z-line was a variable distance from       incisors; the hiatal hernia was sliding.      The exam of the esophagus was otherwise normal.      Multiple localized, small non-bleeding erosions were found in the       prepyloric region of the stomach. There were no stigmata of recent       bleeding. Biopsies were taken with a cold forceps for histology.      The cardia and gastric fundus were normal on retroflexion.      Diffuse mild mucosal variance characterized by altered texture was found       in the entire  duodenum. Biopsies were taken with a cold forceps for       histology. Impression:           -  Z-line irregular. Biopsied.                       - Small hiatal hernia.                       - Non-bleeding erosive gastropathy. Biopsied.                       - Mucosal variant in the duodenum. Biopsied. Recommendation:       - Continue present medications.                       - Return to GI clinic in 3 weeks. Procedure Code(s):    --- Professional ---                       [phone], Esophagogastroduodenoscopy, flexible, transoral;                        with biopsy, single or multiple CPT copyright 2016 American Medical Association. All rights reserved. The codes documented in this report are preliminary and upon coder review may  be revised to meet current compliance requirements. Christena DeemMartin U Babita Amaker, MD 10/01/2016 11:45:32 AM This report has been signed electronically. Number of Addenda: 0 Note Initiated On: 10/01/2016 11:16 AM      [facility]

## 2016-10-01 NOTE — Anesthesia Postprocedure Evaluation (Signed)
Anesthesia Post Note  Patient: Olivia LeatherwoodMelanie Walter  Procedure(s) Performed: Procedure(s) (LRB): ESOPHAGOGASTRODUODENOSCOPY (EGD) WITH PROPOFOL (N/A)  Patient location during evaluation: Endoscopy Anesthesia Type: General Level of consciousness: awake and alert Pain management: pain level controlled Vital Signs Assessment: post-procedure vital signs reviewed and stable Respiratory status: spontaneous breathing and respiratory function stable Cardiovascular status: stable Anesthetic complications: no    Last Vitals:  Vitals:   10/01/16 1147 10/01/16 1148  BP: 101/70 101/70  Pulse: 82 82  Resp:  12  Temp: (!) 35.9 C (!) 35.9 C    Last Pain:  Vitals:   10/01/16 1148  TempSrc: Tympanic                 Talayla Doyel K

## 2016-10-02 ENCOUNTER — Encounter: Payer: Self-pay | Admitting: Gastroenterology

## 2016-10-02 LAB — SURGICAL PATHOLOGY

## 2017-03-10 NOTE — Progress Notes (Signed)
03/11/2017 11:23 AM   Olivia Walter 02-Apr-1986 [id number]  Referring provider: Alba Cory, MD [address] [place], [place] [address]  Chief Complaint  Patient presents with  . New Patient (Initial Visit)    hematuria referred by  [name]    HPI: Patient is a 32 -year-old Caucasian female who presents today as a referral from their OBGYN, Dr. Dalbert Garnet, for hematuria.    Patient was found to positive dip for hematuria on UA's.   She states she had blood in urine in the past due to stones.   She does not have a prior history of recurrent urinary tract infections, trauma to the genitourinary tract or malignancies of the genitourinary tract.   She does have a family medical history of nephrolithiasis (mother and sister),  But she does not have a family history of  malignancies of the genitourinary tract or hematuria.   Today, she is having symptoms of nocturia x 1.  This is baseline.  Her UA today is positive for blood on dip, but microscopically negative.    She states that she is experiencing suprapubic pain that feels like severe menstrual cramps.   This is been occurring for one month.  BM's makes the pain worse.  Heating pad eases the pain.  No radiation.  The pain can last a couple minutes to a few hours. She has not had abdominal pain or flank pain.  She denies any recent fevers, chills, nausea or vomiting.   She had a contrast study in 2017 and no worrisome GU findings were discovered. I have independently reviewed the films.  She is a former smoker, with a 2 ppd history.  Quit on 11/17/2016.  She is not exposed to secondhand smoke.  She does not work with chemicals.  She has a high BMI.    She is experiencing constipation.    She states her sister and mother are hypochondriacs.     PMH: Past Medical History:  Diagnosis Date  . Asthma   . Kidney calculus   . Kidney stones   . Osteoarthritis   . Reactive hypoglycemia     Surgical  History: Past Surgical History:  Procedure Laterality Date  . CAPSULE ENDOSCOPY    . COLONOSCOPY    . cyst removed on wrist    . ESOPHAGOGASTRODUODENOSCOPY    . ESOPHAGOGASTRODUODENOSCOPY (EGD) WITH PROPOFOL N/A 10/01/2016   Procedure: ESOPHAGOGASTRODUODENOSCOPY (EGD) WITH PROPOFOL;  Surgeon: Christena Deem, MD;  Location: [facility] ENDOSCOPY;  Service: Endoscopy;  Laterality: N/A;  . MOUTH SURGERY      Home Medications:  Allergies as of 03/11/2017      Reactions   Bactrim [sulfamethoxazole-trimethoprim]    Clindamycin/lincomycin    Penicillins       Medication List       Accurate as of 03/11/17 11:23 AM. Always use your most recent med list.          aspirin-acetaminophen-caffeine 250-250-65 MG tablet Commonly known as:  EXCEDRIN MIGRAINE Take by mouth every 6 (six) hours as needed for headache.   cyclobenzaprine 10 MG tablet Commonly known as:  FLEXERIL Take 10 mg by mouth as needed for muscle spasms.   ibuprofen 800 MG tablet Commonly known as:  ADVIL,MOTRIN Take 800 mg by mouth every 8 (eight) hours as needed.   MAGNESIUM-POTASSIUM PO Take by mouth.   medroxyPROGESTERone 150 MG/ML injection Commonly known as:  DEPO-PROVERA Inject 150 mg into the muscle every 3 (three) months.   multivitamins with  iron Tabs tablet Take 1 tablet by mouth daily.   ONE-A-DAY 55 PLUS PO Take by mouth.   pantoprazole 40 MG tablet Commonly known as:  PROTONIX Take 40 mg by mouth 2 (two) times daily before a meal.       Allergies:  Allergies  Allergen Reactions  . Bactrim [Sulfamethoxazole-Trimethoprim]   . Clindamycin/Lincomycin   . Penicillins     Family History: Family History  Problem Relation Age of Onset  . Prostate cancer Neg Hx   . Kidney cancer Neg Hx   . Bladder Cancer Neg Hx     Social History:  reports that she has quit smoking. She has never used smokeless tobacco. She reports that she does not drink alcohol or use drugs.  ROS: UROLOGY Frequent  Urination?: No Hard to postpone urination?: No Burning/pain with urination?: No Get up at night to urinate?: Yes Leakage of urine?: No Urine stream starts and stops?: No Trouble starting stream?: No Do you have to strain to urinate?: No Blood in urine?: Yes Urinary tract infection?: No Sexually transmitted disease?: No Injury to kidneys or bladder?: No Painful intercourse?: No Weak stream?: No Currently pregnant?: No Vaginal bleeding?: Yes Last menstrual period?: on depo  Gastrointestinal Nausea?: No Vomiting?: No Indigestion/heartburn?: Yes Diarrhea?: Yes Constipation?: Yes  Constitutional Fever: No Night sweats?: No Weight loss?: No Fatigue?: Yes  Skin Skin rash/lesions?: No Itching?: Yes  Eyes Blurred vision?: No Double vision?: No  Ears/Nose/Throat Sore throat?: No Sinus problems?: No  Hematologic/Lymphatic Swollen glands?: No Easy bruising?: Yes  Cardiovascular Leg swelling?: No Chest pain?: No  Respiratory Cough?: No Shortness of breath?: No  Endocrine Excessive thirst?: No  Musculoskeletal Back pain?: No Joint pain?: Yes  Neurological Headaches?: Yes Dizziness?: No  Psychologic Depression?: No Anxiety?: No  Physical Exam: BP 119/86   Pulse (!) 116   Ht  (1.6 m)   Wt 180 lb 11.2 oz (82 kg)   BMI 32.01 kg/m   Constitutional: Well nourished. Alert and oriented, No acute distress. HEENT: [place] AT, moist mucus membranes. Trachea midline, no masses. Cardiovascular: No clubbing, cyanosis, or edema. Respiratory: Normal respiratory effort, no increased work of breathing. GI: Abdomen is soft, non tender, non distended, no abdominal masses. Liver and spleen not palpable.  No hernias appreciated.  Stool sample for occult testing is not indicated.   GU: No CVA tenderness.  No bladder fullness or masses.   Skin: No rashes, bruises or suspicious lesions. Lymph: No cervical or inguinal adenopathy. Neurologic: Grossly intact, no focal  deficits, moving all 4 extremities. Psychiatric: Normal mood and affect.  Laboratory Data: Lab Results  Component Value Date   WBC 9.8 06/05/2016   HGB 15.2 06/05/2016   HCT 43.4 06/05/2016   MCV 94.0 06/05/2016   PLT 381 06/05/2016    Lab Results  Component Value Date   CREATININE 0.71 06/05/2016     Lab Results  Component Value Date   AST 32 06/05/2016   Lab Results  Component Value Date   ALT 54 06/05/2016     Urinalysis Unremarkable.  See EPIC.    Pertinent Imaging: CLINICAL DATA:  Left upper quadrant pain for 2 weeks  EXAM: CT ABDOMEN AND PELVIS WITH CONTRAST  TECHNIQUE: Multidetector CT imaging of the abdomen and pelvis was performed using the standard protocol following bolus administration of intravenous contrast.  CONTRAST:  ISOVUE-300 IOPAMIDOL (ISOVUE-300) INJECTION 61%  COMPARISON:  None.  FINDINGS: Lower chest:  The lung bases are unremarkable.  Hepatobiliary: Mild  fatty infiltration of the liver. No focal hepatic mass. No calcified gallstones are noted within gallbladder.  Pancreas: No mass, inflammatory changes, or other significant abnormality.  Spleen: Within normal limits in size and appearance.  Adrenals/Urinary Tract: No adrenal gland mass. Enhanced kidneys are symmetrical in size. No hydronephrosis or hydroureter. The urinary bladder is unremarkable.  Stomach/Bowel: Oral contrast material was given to the patient. No gastric outlet obstruction. No thickened or dilated small bowel loops. No small bowel obstruction. There is no pericecal inflammation. The terminal ileum is unremarkable. Normal appendix. No evidence of colitis or diverticulitis. No colonic obstruction. Contrast material noted within distal sigmoid colon.  Vascular/Lymphatic: No aortic aneurysm. No retroperitoneal or mesenteric adenopathy.  Reproductive: The uterus and ovaries are unremarkable. No adnexal mass.  The uterus is normal size  anteflexed.  Other: No ascites or free abdominal air. There is no inguinal adenopathy.  Musculoskeletal: No destructive bony lesions are noted. Sagittal images of the spine are unremarkable. No acute fractures are noted.  IMPRESSION: 1. There is no evidence of acute inflammatory process within abdomen. 2. Mild fatty infiltration of the liver. 3. No hydronephrosis or hydroureter. 4. Normal appendix.  No pericecal inflammation. 5. No small bowel obstruction. 6. No evidence of colitis or diverticulitis.  No adnexal mass.   Electronically Signed   By: Natasha Mead M.D.   On: 06/05/2016 16:06  Assessment & Plan:    1. Positive heme on UA  - At this time the patient does not meet AUA guidelines for acute microscopic hematuria (3 or more red blood cells per high powered field seen on microscopic exam of the urine), she will return next week for a repeated UA and will report any gross hematuria in the interim   - I explained to the patient that there are a number of causes that can be associated with blood in the urine, such as stones, UTI's, damage to the urinary tract and/or cancer.  - UA   2. Pelvic pain  - referral to pelvic pain specialist in works  - ? IC vs ureteral stones - will consider CT - will check UA next week - if negative for [facility] - pursue CT Renal stone study - if positive for [facility] with pursue CTU and cystoscopy   Return in about 1 week (around 03/18/2017) for UA only.  These notes generated with voice recognition software. I apologize for typographical errors.  Michiel Cowboy, PA-C  [facility] [address] [place], [place] [address] [phone]

## 2017-03-11 ENCOUNTER — Encounter: Payer: Self-pay | Admitting: Urology

## 2017-03-11 ENCOUNTER — Ambulatory Visit: Payer: BLUE CROSS/BLUE SHIELD | Admitting: Urology

## 2017-03-11 VITALS — BP 119/86 | HR 116 | Ht 63.0 in | Wt 180.7 lb

## 2017-03-11 DIAGNOSIS — R102 Pelvic and perineal pain: Secondary | ICD-10-CM

## 2017-03-11 DIAGNOSIS — R8290 Unspecified abnormal findings in urine: Secondary | ICD-10-CM | POA: Diagnosis not present

## 2017-03-11 LAB — URINALYSIS, COMPLETE
Bilirubin, UA: NEGATIVE
Glucose, UA: NEGATIVE
Ketones, UA: NEGATIVE
LEUKOCYTES UA: NEGATIVE
NITRITE UA: NEGATIVE
PH UA: 7 (ref 5.0–7.5)
Protein, UA: NEGATIVE
Specific Gravity, UA: <1.005 — ABNORMAL LOW (ref 1.005–1.030)
Urobilinogen, Ur: 0.2 mg/dL (ref 0.2–1.0)

## 2017-03-18 ENCOUNTER — Other Ambulatory Visit: Payer: Self-pay | Admitting: Urology

## 2017-03-18 ENCOUNTER — Other Ambulatory Visit: Payer: BLUE CROSS/BLUE SHIELD

## 2017-03-18 ENCOUNTER — Other Ambulatory Visit: Payer: Self-pay

## 2017-03-18 DIAGNOSIS — R102 Pelvic and perineal pain: Secondary | ICD-10-CM

## 2017-03-18 DIAGNOSIS — R8290 Unspecified abnormal findings in urine: Secondary | ICD-10-CM

## 2017-03-18 LAB — URINALYSIS, COMPLETE
BILIRUBIN UA: NEGATIVE
GLUCOSE, UA: NEGATIVE
KETONES UA: NEGATIVE
Leukocytes, UA: NEGATIVE
Nitrite, UA: NEGATIVE
PROTEIN UA: NEGATIVE
SPEC GRAV UA: 1.015 (ref 1.005–1.030)
Urobilinogen, Ur: 0.2 mg/dL (ref 0.2–1.0)
pH, UA: 6 (ref 5.0–7.5)

## 2017-03-18 LAB — MICROSCOPIC EXAMINATION
RBC, UA: NONE SEEN /hpf (ref 0–?)
WBC, UA: NONE SEEN /hpf (ref 0–?)

## 2017-03-19 ENCOUNTER — Telehealth: Payer: Self-pay

## 2017-03-19 NOTE — Telephone Encounter (Signed)
-----   Message from Harle Battiest, PA-C sent at 03/18/2017  7:38 PM EDT ----- Please notify the patient that her urine was negative for microscopic blood. Therefore, we will pursue a CT renal stone study. I have placed orders in her chart.  I have also put in orders for a pregnancy test.

## 2017-03-19 NOTE — Telephone Encounter (Signed)
Spoke with pt in reference to micro hematuria, CT, and pregnancy test. Pt voiced understanding.

## 2017-04-01 ENCOUNTER — Ambulatory Visit
Admission: RE | Admit: 2017-04-01 | Discharge: 2017-04-01 | Disposition: A | Discharge status: Home / Self Care | Payer: BLUE CROSS/BLUE SHIELD | Source: Ambulatory Visit | Attending: Urology | Admitting: Urology

## 2017-04-01 DIAGNOSIS — R102 Pelvic and perineal pain: Secondary | ICD-10-CM | POA: Insufficient documentation

## 2017-04-01 DIAGNOSIS — R109 Unspecified abdominal pain: Secondary | ICD-10-CM | POA: Diagnosis not present

## 2017-04-02 ENCOUNTER — Telehealth: Payer: Self-pay

## 2017-04-02 NOTE — Telephone Encounter (Signed)
LMOM

## 2017-04-02 NOTE — Telephone Encounter (Signed)
-----   Message from Harle BattiestShannon A McGowan, PA-C sent at 04/02/2017  7:55 AM EDT ----- Please let Ms. Kevorkian know that her CT scan did not show any kidney stones.   If she is still experiencing her pain, I recommend that she work with her PCP to relieve her constipation.

## 2017-04-03 NOTE — Telephone Encounter (Signed)
Spoke with pt in reference to CT results and constipation. Pt voiced understanding.

## 2017-05-23 DIAGNOSIS — R102 Pelvic and perineal pain: Secondary | ICD-10-CM | POA: Insufficient documentation

## 2017-05-23 DIAGNOSIS — N939 Abnormal uterine and vaginal bleeding, unspecified: Secondary | ICD-10-CM | POA: Insufficient documentation

## 2017-06-21 ENCOUNTER — Encounter: Payer: Self-pay | Admitting: Emergency Medicine

## 2017-06-21 ENCOUNTER — Emergency Department
Admission: EM | Admit: 2017-06-21 | Discharge: 2017-06-21 | Disposition: A | Discharge status: Home / Self Care | Payer: BLUE CROSS/BLUE SHIELD | Attending: Emergency Medicine | Admitting: Emergency Medicine

## 2017-06-21 DIAGNOSIS — R51 Headache: Secondary | ICD-10-CM | POA: Diagnosis present

## 2017-06-21 DIAGNOSIS — Z87891 Personal history of nicotine dependence: Secondary | ICD-10-CM | POA: Diagnosis not present

## 2017-06-21 DIAGNOSIS — G43909 Migraine, unspecified, not intractable, without status migrainosus: Secondary | ICD-10-CM

## 2017-06-21 DIAGNOSIS — Z79899 Other long term (current) drug therapy: Secondary | ICD-10-CM | POA: Diagnosis not present

## 2017-06-21 DIAGNOSIS — J45909 Unspecified asthma, uncomplicated: Secondary | ICD-10-CM | POA: Diagnosis not present

## 2017-06-21 DIAGNOSIS — G43009 Migraine without aura, not intractable, without status migrainosus: Secondary | ICD-10-CM | POA: Diagnosis not present

## 2017-06-21 MED ORDER — METOCLOPRAMIDE HCL 5 MG/ML IJ SOLN
10.0000 mg | Freq: Once | INTRAMUSCULAR | Status: AC
  Administered 2017-06-21: 10 mg via INTRAVENOUS
  Filled 2017-06-21: qty 2

## 2017-06-21 MED ORDER — KETOROLAC TROMETHAMINE 10 MG PO TABS: 10.0000 mg | ORAL_TABLET | Freq: Four times a day (QID) | ORAL | 0 refills | Status: DC | PRN

## 2017-06-21 MED ORDER — SUMATRIPTAN SUCCINATE 6 MG/0.5ML ~~LOC~~ SOLN
6.0000 mg | Freq: Once | SUBCUTANEOUS | Status: AC
  Administered 2017-06-21: 6 mg via SUBCUTANEOUS
  Filled 2017-06-21: qty 0.5

## 2017-06-21 MED ORDER — KETOROLAC TROMETHAMINE 30 MG/ML IJ SOLN
30.0000 mg | Freq: Once | INTRAMUSCULAR | Status: AC
  Administered 2017-06-21: 30 mg via INTRAVENOUS
  Filled 2017-06-21: qty 1

## 2017-06-21 NOTE — ED Provider Notes (Signed)
[facility] Emergency Department Provider Note    First MD Initiated Contact with Patient 06/21/17 0507     (approximate)  I have reviewed the triage vital signs and the nursing notes.   HISTORY  Chief Complaint Migraine    HPI Olivia Walter is a 31 y.o. female with below list of chronic medical conditions presents to the emergency department with migraine headache consistent with previous migraine headache with current pain score of 9 out of 10. No weakness numbness ort gait instability. No visual changes   Past Medical History:  Diagnosis Date  . Asthma   . Kidney calculus   . Kidney stones   . Osteoarthritis   . Reactive hypoglycemia     There are no active problems to display for this patient.   Past Surgical History:  Procedure Laterality Date  . CAPSULE ENDOSCOPY    . COLONOSCOPY    . cyst removed on wrist    . ESOPHAGOGASTRODUODENOSCOPY    . ESOPHAGOGASTRODUODENOSCOPY (EGD) WITH PROPOFOL N/A 10/01/2016   Procedure: ESOPHAGOGASTRODUODENOSCOPY (EGD) WITH PROPOFOL;  Surgeon: Christena DeemMartin U Skulskie, MD;  [facility] ENDOSCOPY;  Service: Endoscopy;  Laterality: N/A;  . MOUTH SURGERY      Prior to Admission medications   Medication Sig Start Date End Date Taking? Authorizing Provider  aspirin-acetaminophen-caffeine (EXCEDRIN MIGRAINE) [phone] MG tablet Take by mouth every 6 (six) hours as needed for headache.    [provider]  cyclobenzaprine (FLEXERIL) 10 MG tablet Take 10 mg by mouth as needed for muscle spasms.    [provider]  ibuprofen (ADVIL,MOTRIN) 800 MG tablet Take 800 mg by mouth every 8 (eight) hours as needed.    [provider]  ketorolac (TORADOL) 10 MG tablet Take 1 tablet (10 mg total) by mouth every 6 (six) hours as needed. 06/21/17   Darci CurrentBrown, [place] N, MD  MAGNESIUM-POTASSIUM PO Take by mouth.    [provider]  medroxyPROGESTERone (DEPO-PROVERA) 150 MG/ML injection Inject 150 mg  into the muscle every 3 (three) months.    [provider]  Multiple Vitamin (ONE-A-DAY 55 PLUS PO) Take by mouth.    [provider]  Multiple Vitamins-Iron (MULTIVITAMINS WITH IRON) TABS tablet Take 1 tablet by mouth daily.    [provider]  pantoprazole (PROTONIX) 40 MG tablet Take 40 mg by mouth 2 (two) times daily before a meal.    [provider]    Allergies Bactrim [sulfamethoxazole-trimethoprim]; Clindamycin/lincomycin; and Penicillins  Family History  Problem Relation Age of Onset  . Prostate cancer Neg Hx   . Kidney cancer Neg Hx   . Bladder Cancer Neg Hx     Social History Social History  Substance Use Topics  . Smoking status: Former Games developermoker  . Smokeless tobacco: Never Used     Comment: Quit in Dec 2017  . Alcohol use No    Review of Systems Constitutional: No fever/chills Eyes: No visual changes. ENT: No sore throat. Cardiovascular: Denies chest pain. Respiratory: Denies shortness of breath. Gastrointestinal: No abdominal pain.  No nausea, no vomiting.  No diarrhea.  No constipation. Genitourinary: Negative for dysuria. Musculoskeletal: Negative for neck pain.  Negative for back pain. Integumentary: Negative for rash. Neurological: Positive for headaches, Negative for focal weakness or numbness.   ____________________________________________   PHYSICAL EXAM:  VITAL SIGNS: ED Triage Vitals [06/21/17 0457]  Enc Vitals Group     BP 122/70     Pulse Rate (!) 116     Resp 18  Temp 97.8 F (36.6 C)     Temp Source Oral     SpO2 99 %     Weight 85.3 kg (188 lb)     Height 1.6 m (5\' 3" )     Head Circumference      Peak Flow      Pain Score 9     Pain Loc      Pain Edu?      Excl. in GC?     Constitutional: Alert and oriented. Well appearing and in no acute distress. Eyes: Conjunctivae are normal.  Head: Atraumatic. Mouth/Throat: Mucous membranes are moist.  Oropharynx non-erythematous. Neck: No  stridor. Cardiovascular: Normal rate, regular rhythm. Good peripheral circulation. Grossly normal heart sounds. Respiratory: Normal respiratory effort.  No retractions. Lungs CTAB. Gastrointestinal: Soft and nontender. No distention.  Musculoskeletal: No lower extremity tenderness nor edema. No gross deformities of extremities. Neurologic:  Normal speech and language. No gross focal neurologic deficits are appreciated.  Skin:  Skin is warm, dry and intact. No rash noted. Psychiatric: Mood and affect are normal. Speech and behavior are normal.    Procedures   ____________________________________________   INITIAL IMPRESSION / ASSESSMENT AND PLAN / ED COURSE  Pertinent labs & imaging results that were available during my care of the patient were reviewed by me and considered in my medical decision making (see chart for details).  Patient given Imitrex 6 mg subcutaneous. Toradol 30 mg and Reglan with movement of headache. Patient be prescribed Toradol for home with recommendation to follow up with primary care provider today      ____________________________________________  FINAL CLINICAL IMPRESSION(S) / ED DIAGNOSES  Final diagnoses:  Migraine without status migrainosus, not intractable, unspecified migraine type     MEDICATIONS GIVEN DURING THIS VISIT:  Medications  ketorolac (TORADOL) 30 MG/ML injection 30 mg (30 mg Intravenous Given 06/21/17 0550)  metoCLOPramide (REGLAN) injection 10 mg (10 mg Intravenous Given 06/21/17 0550)  SUMAtriptan (IMITREX) injection 6 mg (6 mg Subcutaneous Given 06/21/17 0550)     NEW OUTPATIENT MEDICATIONS STARTED DURING THIS VISIT:  New Prescriptions   KETOROLAC (TORADOL) 10 MG TABLET    Take 1 tablet (10 mg total) by mouth every 6 (six) hours as needed.    Modified Medications   No medications on file    Discontinued Medications   No medications on file     Note:  This document was prepared using Dragon voice recognition  software and may include unintentional dictation errors.    Darci CurrentBrown, [place] N, MD 06/21/17 0630

## 2017-06-21 NOTE — ED Triage Notes (Signed)
Patient ambulatory to triage with steady gait, without difficulty or distress noted; pt reports generalized migraine x 3 days accomp by nausea and light sensitivity; st hx of same

## 2017-07-12 ENCOUNTER — Encounter: Payer: Self-pay | Admitting: Physical Therapy

## 2017-07-12 ENCOUNTER — Ambulatory Visit: Payer: BLUE CROSS/BLUE SHIELD | Attending: Obstetrics and Gynecology | Admitting: Physical Therapy

## 2017-07-12 DIAGNOSIS — M5442 Lumbago with sciatica, left side: Secondary | ICD-10-CM | POA: Insufficient documentation

## 2017-07-12 DIAGNOSIS — M4123 Other idiopathic scoliosis, cervicothoracic region: Secondary | ICD-10-CM

## 2017-07-12 DIAGNOSIS — M5441 Lumbago with sciatica, right side: Secondary | ICD-10-CM | POA: Insufficient documentation

## 2017-07-12 DIAGNOSIS — G8929 Other chronic pain: Secondary | ICD-10-CM

## 2017-07-12 DIAGNOSIS — M791 Myalgia, unspecified site: Secondary | ICD-10-CM

## 2017-07-12 DIAGNOSIS — M533 Sacrococcygeal disorders, not elsewhere classified: Secondary | ICD-10-CM
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
## 2017-07-12 NOTE — Patient Instructions (Addendum)
Gratitude journal   _   Decrease downward forces onto pelvic floor: Log roll out of bed instead of jumping out of bed with a crunch Exhale as you lift or as you get out of the chair (against gravity and loads)  Sit with feet under knees

## 2017-07-19 ENCOUNTER — Ambulatory Visit: Payer: BLUE CROSS/BLUE SHIELD | Admitting: Physical Therapy

## 2017-07-19 VITALS — BP 109/74

## 2017-07-19 DIAGNOSIS — M791 Myalgia, unspecified site: Secondary | ICD-10-CM

## 2017-07-19 DIAGNOSIS — M5442 Lumbago with sciatica, left side: Secondary | ICD-10-CM

## 2017-07-19 DIAGNOSIS — M4123 Other idiopathic scoliosis, cervicothoracic region: Secondary | ICD-10-CM

## 2017-07-19 DIAGNOSIS — M5441 Lumbago with sciatica, right side: Secondary | ICD-10-CM

## 2017-07-19 DIAGNOSIS — G8929 Other chronic pain: Secondary | ICD-10-CM

## 2017-07-19 DIAGNOSIS — M533 Sacrococcygeal disorders, not elsewhere classified: Secondary | ICD-10-CM

## 2017-07-19 NOTE — Therapy (Addendum)
[place] [facility] MAIN [facility] SERVICES [address] [place], [place], [address] Phone: [phone]   Fax:  [phone]  Physical Therapy Treatment  Patient Details  Name: Olivia Walter MRN: [id number] Date of Birth: [birth year] No Data Recorded  Encounter Date: 07/19/2017    Past Medical History:  Diagnosis Date  . Asthma   . Kidney calculus   . Kidney stones   . Osteoarthritis   . Reactive hypoglycemia     Past Surgical History:  Procedure Laterality Date  . CAPSULE ENDOSCOPY    . COLONOSCOPY    . cyst removed on wrist    . ESOPHAGOGASTRODUODENOSCOPY    . ESOPHAGOGASTRODUODENOSCOPY (EGD) WITH PROPOFOL N/A 10/01/2016   Procedure: ESOPHAGOGASTRODUODENOSCOPY (EGD) WITH PROPOFOL;  Surgeon: Christena Deem, MD;  Location: [facility] ENDOSCOPY;  Service: Endoscopy;  Laterality: N/A;  . MOUTH SURGERY      Vitals:   07/19/17 1032  BP: 109/74        Subjective Assessment - 07/19/17 1010    Subjective Pt experienced intense pain 10/10 in her L lower abdominal area two days ago and she was close to going to the ER but did not.  8/10 level of pain was constant pressure sensation, 10/10 pain was sharp sudden stabbing pains that felt like she could pass out a kidney stone. Pt thinks she is passing a kidney stone but in the past the kidneys she had had occured on the R side. This pain that came on suddenly two days ago, was on the L side and is close to the area where her pelvic pain has been. She had feelings of fullness in her bladder but 3 out 5 trips to the bathroom, she did not eliminate urine. The pain subsided today to 7/10. Random sharp pain still occurs with initiating urination and also occurs with sitting at random times. Pt did get hot and sweaty and nauseous  with the worst of the pain.  Denied vomitting. Pt contacted her urologist but the call was disconnected after she told the person at the clinic that her Sx were getting better. Pt feels today that  the Sx are resolving.  Pt is drinking more water.       ** Pt states she had an absess located at the clitoris  which was lanced last month. Pt had a yeast infection following the operation due to antibiotics. The yeast infection Sx has cleared up with Dicoflan medication.    Pt has had less radiating pain after riding the stationary bike with a new and more firm and skinny seat.      Pertinent History Abdominal wall: Current hernia between diaphragm and belly button and she has been told that it does not need to get fixed.  Lower kinetic chain: Pt wore a cast boot 2/2 achilles tendon injury for 1 year in 2004.  Lifestyle behavior:   Pt had quit smoking in Dec 2017 by gradually decreased and then cold [place].  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 Multiple ankle sprains, as a kid, pt has been told she has curves in her spine    Patient Stated Goals to decrease pain and to avoid surgery (hysterectomy )             [facility] PT Assessment - 07/19/17 1036      Observation/Other Assessments   Observations B ASIS symmetrical, ASIS to medial malleoli B 86 cm  Palpation   Palpation comment tenderness at L medial aspect of iliac crest referred back in the L flank                   Pelvic Floor Special Questions - 07/19/17 1157    External Perineal Exam pt consented to doffing under garments for assessment    External Palpation no reproduction of L abdominal pain with palpation over external pelvic floor mm, nor with soft tissue glides over/under pubic tubercles, L labia minor/ majora and near area of  lance operation for removal of abcess.             [facility] Adult PT Treatment/Exercise - 07/19/17 1159      Therapeutic Activites    Therapeutic Activities --  assessed pelvic assessment ,phoned urologist to schedule apt for 8/27 @10am                      PT Long Term Goals - 07/15/17 1327      PT LONG  TERM GOAL #1   Title Pt will decrease her  score on    from % to < % in order to return to ADLs   Time 12   Period Weeks   Status New   Target Date 09/23/17     PT LONG TERM GOAL #2   Title Pt will demo IND with scoliosis HEP in order to decrease pain    Time 12   Period Weeks   Status New   Target Date 09/23/17     PT LONG TERM GOAL #3   Title Pt will be compliant with wearing heel lift in order to minimize pelvic obliquities and to minimize L abdominal pain   Time 12   Period Weeks   Status New   Target Date 07/22/17     PT LONG TERM GOAL #4   Title Pt will report decreased B sciatic pain by 50% in order to ride bike   Time 12   Period Weeks   Status New   Target Date 08/15/17     PT LONG TERM GOAL #5   Title Pt will demo no lumbopelvic perturbation with deep core level 2-3 for 5 reps and proper pelvic floor ROM without tensions in order to optimize intraabdominal pressure system for postural stability and progression of fitness exercises   Time 12   Period Weeks   Status New   Target Date 09/23/17               Plan - 07/19/17 1027    Clinical Impression Statement Pt returned to PT on her 2nd visit and showed more symmetrical alignment of her pelvic girdle and with equal leg length. Pt also stated she has had less radiating LBP after switching to a better fitting seat on her stationery bike.    Two days ago, pt reported sudden onset of sharp  8/10 level of pain was constant pressure sensation, 10/10 pain was sharp sudden stabbing pains that felt like she could pass out a kidney stone. Pt thinks she is passing a kidney stone but in the past, she had had that occured on the R side whereas this sudden onset of pain came on on the L side and is close to the area where her pelvic pain has been. She had feelings of fullness in her bladder but 3 out 5 trips to the bathroom, she did not eliminate urine. The pain subsided today to 7/10. Random sharp pain  still occurs with  initiating urination and also occurs with sitting at random times. Pt did get hot and sweaty and nauseous  with the worst of the pain.  Denied vomitting.   This pain was not reproduced through PT assessment externally on the vulva, suprapubic area, abdomen.  Pt did report referred pain to L flank with palpation over L suprapubic area. PT has communicated with staff at [facility] and an appt has been scheduled with Michiel Cowboy on 07/22/17.   Pt was educated on proper pelvic floor lengthening and coordination technique with bowel and urine elimination. Pt demo'd correctly following Tx.    Plan to communicate with Urologist to make sure she is clear for continuation of pelvic health PT after she undergoes medical work -up.          Patient will benefit from skilled therapeutic intervention in order to improve the following deficits and impairments:     Visit Diagnosis: Other idiopathic scoliosis, cervicothoracic region  Sacrococcygeal disorders, not elsewhere classified  Myalgia  Chronic bilateral low back pain with bilateral sciatica     Problem List There are no active problems to display for this patient.   Mariane Masters ,PT, DPT, E-RYT  07/19/2017, 12:02 PM  [place] [facility] MAIN [facility] SERVICES [address] [place], [place], [address] Phone: [phone]   Fax:  [phone]  Name: Olivia Walter MRN: [id number] Date of Birth: 09-28-1986

## 2017-07-20 NOTE — Progress Notes (Signed)
07/22/2017 11:26 AM   Olivia Walter 03-07-1986 [id number]  Referring provider: Alba Cory, MD [address] [place], [place] [address]  Chief Complaint  Patient presents with  . Abdominal Pain    malodorous urine   . Results    CT    HPI: 31 yo WF who presents today as a referral from Dr. Evette Cristal- Parke Simmers for sharp pains, bladder fullness, incomplete emptying and a foul odor to her urine.  Background history Patient is a 11 -year-old Caucasian female who presents today as a referral from their OBGYN, Dr. Dalbert Garnet, for hematuria.  Patient was found to positive dip for hematuria on UA's.   She states she had blood in urine in the past due to stones.   She does not have a prior history of recurrent urinary tract infections, trauma to the genitourinary tract or malignancies of the genitourinary tract.  She does have a family medical history of nephrolithiasis (mother and sister),  But she does not have a family history of  malignancies of the genitourinary tract or hematuria.  Today, she is having symptoms of nocturia x 1.  This is baseline.  Her UA today is positive for blood on dip, but microscopically negative.  She states that she is experiencing suprapubic pain that feels like severe menstrual cramps.   This is been occurring for one month.  BM's makes the pain worse.  Heating pad eases the pain.  No radiation.  The pain can last a couple minutes to a few hours. She has not had abdominal pain or flank pain.  She denies any recent fevers, chills, nausea or vomiting.  She had a contrast study in 2017 and no worrisome GU findings were discovered. I have independently reviewed the films.  She is a former smoker, with a 2 ppd history.  Quit on 11/17/2016.  She is not exposed to secondhand smoke.  She does not work with chemicals.  She has a high BMI.   She is experiencing constipation.   She states her sister and mother are hypochondriacs.    CT Renal stone study performed on  04/01/2017 noted no acute findings in the abdomen or pelvis. Specifically, no findings to explain the patient's history of left flank pain.  Today, she has been experiencing urgency x 0-3, frequency x 8 or more, not restricting fluids to avoid visits to the restroom, is engaging in toilet mapping, incontinence x 0-3 and nocturia x 0-3.  Her PVR is 269 mL.   Her UA is negative.  She states it is painful to urinate.  A week ago today, she started to have constant pain in the LLQ.  She describes the pain as crampy with random stabbing pains. The crampy pain is constant.  The stabbing pain is intermittent lasting for a few hours.  8- to "made her feels like she was going to pass out"/10 pain.  Nothing makes the pain worse.  Heating pad helped a little bit.   BM's are regular.  She denies gross hematuria and dysuria.  She has an ache after she urinates.  She has not had fevers, chills and vomiting.  She is having nausea with the stabbing pain.  Her UA is negative.  She is also having urgency.      PMH: Past Medical History:  Diagnosis Date  . Asthma   . Kidney calculus   . Kidney stones   . Osteoarthritis   . Reactive hypoglycemia     Surgical History:  Past Surgical History:  Procedure Laterality Date  . CAPSULE ENDOSCOPY    . COLONOSCOPY    . cyst removed on wrist    . ESOPHAGOGASTRODUODENOSCOPY    . ESOPHAGOGASTRODUODENOSCOPY (EGD) WITH PROPOFOL N/A 10/01/2016   Procedure: ESOPHAGOGASTRODUODENOSCOPY (EGD) WITH PROPOFOL;  Surgeon: Christena Deem, MD;  Location: [facility] ENDOSCOPY;  Service: Endoscopy;  Laterality: N/A;  . MOUTH SURGERY      Home Medications:  Allergies as of 07/22/2017      Reactions   Bactrim [sulfamethoxazole-trimethoprim]    Bee Venom    Clindamycin/lincomycin    Penicillins       Medication List       Accurate as of 07/22/17 11:26 AM. Always use your most recent med list.          aspirin-acetaminophen-caffeine 250-250-65 MG tablet Commonly known as:   EXCEDRIN MIGRAINE Take by mouth every 6 (six) hours as needed for headache.   clindamycin 1 % external solution Commonly known as:  CLEOCIN T Apply topically 2 (two) times daily to the affected areas.   cyclobenzaprine 10 MG tablet Commonly known as:  FLEXERIL Take 10 mg by mouth as needed for muscle spasms.   dexamethasone 1 MG tablet Commonly known as:  DECADRON Take 1 tablet (1 mg total) by mouth once for 1 dose at 11 PM the night before your lab test.   DEXILANT 60 MG capsule Generic drug:  dexlansoprazole Dexilant 60 mg capsule, delayed release   ibuprofen 800 MG tablet Commonly known as:  ADVIL,MOTRIN Take 800 mg by mouth every 8 (eight) hours as needed.   ketorolac 10 MG tablet Commonly known as:  TORADOL Take 1 tablet (10 mg total) by mouth every 6 (six) hours as needed.   MAGNESIUM-POTASSIUM PO Take by mouth.   medroxyPROGESTERone 150 MG/ML injection Commonly known as:  DEPO-PROVERA Inject 150 mg into the muscle every 3 (three) months.   multivitamins with iron Tabs tablet Take 1 tablet by mouth daily.   ONE-A-DAY 55 PLUS PO Take by mouth.   pantoprazole 40 MG tablet Commonly known as:  PROTONIX Take 40 mg by mouth 2 (two) times daily before a meal.   SUMAtriptan 6 MG/0.5ML Soaj sumatriptan 6 mg/0.5 mL subcutaneous pen injector            Discharge Care Instructions        Start     Ordered   07/22/17 0000  Urinalysis, Complete     07/22/17 1035   07/22/17 0000  BLADDER SCAN AMB NON-IMAGING     07/22/17 1035   07/22/17 0000  CT Abdomen Pelvis W Contrast    Question Answer Comment  If indicated for the ordered procedure, I authorize the administration of contrast media per Radiology protocol Yes   Is patient pregnant? No   Preferred imaging location? [facility]   Radiology Contrast Protocol - do NOT remove file path \\[email or web address]      07/22/17 1122   07/22/17 0000  hCG, serum, qualitative     07/22/17  1126   07/22/17 0000  BUN+Creat     07/22/17 1126      Allergies:  Allergies  Allergen Reactions  . Bactrim [Sulfamethoxazole-Trimethoprim]   . Bee Venom   . Clindamycin/Lincomycin   . Penicillins     Family History: Family History  Problem Relation Age of Onset  . Prostate cancer Neg Hx   . Kidney cancer Neg Hx   . Bladder Cancer Neg Hx     Social History:  reports that she has quit smoking. She has never used smokeless tobacco. She reports that she does not drink alcohol or use drugs.  ROS: UROLOGY Frequent Urination?: No Hard to postpone urination?: No Burning/pain with urination?: Yes Get up at night to urinate?: No Leakage of urine?: No Urine stream starts and stops?: No Trouble starting stream?: No Do you have to strain to urinate?: No Blood in urine?: No Urinary tract infection?: No Sexually transmitted disease?: No Injury to kidneys or bladder?: No Painful intercourse?: No Weak stream?: No Currently pregnant?: No Vaginal bleeding?: No Last menstrual period?: n  Gastrointestinal Nausea?: No Vomiting?: No Indigestion/heartburn?: No Diarrhea?: No Constipation?: No  Constitutional Fever: No Night sweats?: No Weight loss?: No Fatigue?: No  Skin Skin rash/lesions?: No Itching?: No  Eyes Blurred vision?: No Double vision?: No  Ears/Nose/Throat Sore throat?: No Sinus problems?: No  Hematologic/Lymphatic Swollen glands?: No Easy bruising?: No  Cardiovascular Leg swelling?: No Chest pain?: No  Respiratory Cough?: No Shortness of breath?: No  Endocrine Excessive thirst?: No  Musculoskeletal Back pain?: Yes Joint pain?: No  Neurological Headaches?: No Dizziness?: No  Psychologic Depression?: No Anxiety?: No  Physical Exam: BP 115/79   Pulse (!) 103   Ht 5\' 3"  (1.6 m)   Wt 184 lb 11.2 oz (83.8 kg)   BMI 32.72 kg/m   Constitutional: Well nourished. Alert and oriented, No acute distress. HEENT: [place] AT, moist mucus  membranes. Trachea midline, no masses. Cardiovascular: No clubbing, cyanosis, or edema. Respiratory: Normal respiratory effort, no increased work of breathing. GI: Abdomen is soft, LLQ tenderness, non distended, no abdominal masses. Liver and spleen not palpable.  No hernias appreciated.  Stool sample for occult testing is not indicated.   GU: No CVA tenderness.  No bladder fullness or masses.  Normal external genitalia, normal pubic hair distribution, no lesions.  Normal urethral meatus, no lesions, no prolapse, no discharge.   No urethral masses, tenderness and/or tenderness. No bladder fullness, tenderness or masses. Normal vagina mucosa, good estrogen effect, no discharge, no lesions, good pelvic support, no cystocele or rectocele noted.  No cervical motion tenderness.  Uterus is freely mobile and non-fixed.  No adnexal/parametria masses or tenderness noted.  Anus and perineum are without rashes or lesions.    Skin: No rashes, bruises or suspicious lesions. Lymph: No cervical or inguinal adenopathy. Neurologic: Grossly intact, no focal deficits, moving all 4 extremities. Psychiatric: Normal mood and affect.  Laboratory Data: Urinalysis Unremarkable.  See EPIC.    I have reviewed the labs  Pertinent Imaging: CLINICAL DATA:  Left flank pain radiates to left groin.  EXAM: CT ABDOMEN AND PELVIS WITHOUT CONTRAST  TECHNIQUE: Multidetector CT imaging of the abdomen and pelvis was performed following the standard protocol without IV contrast.  COMPARISON:  06/05/2016  FINDINGS: Lower chest:  Unremarkable.  Hepatobiliary: No focal abnormality in the liver on this study without intravenous contrast. There is no evidence for gallstones, gallbladder wall thickening, or pericholecystic fluid. No intrahepatic or extrahepatic biliary dilation.  Pancreas: No focal mass lesion. No dilatation of the main duct. No intraparenchymal cyst. No peripancreatic edema.  Spleen: No  splenomegaly. No focal mass lesion.  Adrenals/Urinary Tract: No adrenal nodule or mass. No stones in either kidney or ureter no secondary changes in either kidney or ureter. The urinary bladder appears normal for the degree of distention.  Stomach/Bowel: Stomach is nondistended. No gastric wall thickening. No evidence of outlet obstruction. Duodenum is normally positioned as is the ligament of Treitz. No  small bowel wall thickening. No small bowel dilatation. The terminal ileum is normal. The appendix is normal. No gross colonic mass. No colonic wall thickening. No substantial diverticular change.  Vascular/Lymphatic: No abdominal aortic aneurysm. No abdominal aortic atherosclerotic calcification. There is no gastrohepatic or hepatoduodenal ligament lymphadenopathy. No intraperitoneal or retroperitoneal lymphadenopathy. No pelvic sidewall lymphadenopathy.  Reproductive: The uterus has normal CT imaging appearance. There is no adnexal mass.  Other: No intraperitoneal free fluid.  Musculoskeletal: Bone windows reveal no worrisome lytic or sclerotic osseous lesions.  IMPRESSION: 1. No acute findings in the abdomen or pelvis. Specifically, no findings to explain the patient's history of left flank pain.   Electronically Signed   By: Kennith Center M.D.   On: 04/01/2017 18:20   Assessment & Plan:    1. Left lower quadrant pain  - obtain a contrast CT - non contrast CT negative in 03/2017 - ? Diverticulitis, ovarian process and/or stone  - RTC for report  - Advised to contact our office or seek treatment in the ED if becomes febrile or pain/ vomiting are difficult control in order to arrange for emergent/urgent intervention  2. Feelings of incomplete emptying  - PVR is 269 mL  - will reassess once CT scan is completed  3. Malodorous urine  - UA is clear  - encouraged to increase fluids   Return for CT report.  These notes generated with voice recognition  software. I apologize for typographical errors.  Michiel Cowboy, PA-C  [facility] [address] [place], [place] [address] [phone]

## 2017-07-22 ENCOUNTER — Ambulatory Visit: Payer: BLUE CROSS/BLUE SHIELD | Admitting: Urology

## 2017-07-22 ENCOUNTER — Encounter: Payer: Self-pay | Admitting: Urology

## 2017-07-22 VITALS — BP 115/79 | HR 103 | Ht 63.0 in | Wt 184.7 lb

## 2017-07-22 DIAGNOSIS — R1032 Left lower quadrant pain: Secondary | ICD-10-CM

## 2017-07-22 DIAGNOSIS — R8290 Unspecified abnormal findings in urine: Secondary | ICD-10-CM | POA: Diagnosis not present

## 2017-07-22 DIAGNOSIS — R3914 Feeling of incomplete bladder emptying: Secondary | ICD-10-CM

## 2017-07-22 LAB — URINALYSIS, COMPLETE
BILIRUBIN UA: NEGATIVE
Glucose, UA: NEGATIVE
Ketones, UA: NEGATIVE
LEUKOCYTES UA: NEGATIVE
NITRITE UA: NEGATIVE
PH UA: 6.5 (ref 5.0–7.5)
Protein, UA: NEGATIVE
Specific Gravity, UA: <1.005 — ABNORMAL LOW (ref 1.005–1.030)
Urobilinogen, Ur: 0.2 mg/dL (ref 0.2–1.0)

## 2017-07-22 LAB — MICROSCOPIC EXAMINATION
Bacteria, UA: NONE SEEN
RBC MICROSCOPIC, UA: NONE SEEN /HPF (ref 0–?)
WBC UA: NONE SEEN /HPF (ref 0–?)

## 2017-07-22 LAB — BLADDER SCAN AMB NON-IMAGING: SCAN RESULT: 269

## 2017-07-23 LAB — BUN+CREAT
BUN / CREAT RATIO: 6 — AB (ref 9–23)
BUN: 5 mg/dL — AB (ref 6–20)
CREATININE: 0.78 mg/dL (ref 0.57–1.00)
GFR, EST AFRICAN AMERICAN: 116 mL/min/{1.73_m2} (ref >59)
GFR, EST NON AFRICAN AMERICAN: 101 mL/min/{1.73_m2} (ref >59)

## 2017-07-23 LAB — HCG, SERUM, QUALITATIVE: hCG,Beta Subunit,Qual,Serum: NEGATIVE m[IU]/mL (ref <6)
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
## 2017-07-23 NOTE — Addendum Note (Signed)
Addended by: Mariane Masters on: 07/23/2017 12:34 PM   Modules accepted: Orders

## 2017-07-26 ENCOUNTER — Ambulatory Visit: Payer: BLUE CROSS/BLUE SHIELD | Admitting: Physical Therapy

## 2017-07-26 DIAGNOSIS — M4123 Other idiopathic scoliosis, cervicothoracic region: Secondary | ICD-10-CM

## 2017-07-26 DIAGNOSIS — M791 Myalgia, unspecified site: Secondary | ICD-10-CM

## 2017-07-26 DIAGNOSIS — G8929 Other chronic pain: Secondary | ICD-10-CM

## 2017-07-26 DIAGNOSIS — M5442 Lumbago with sciatica, left side: Secondary | ICD-10-CM

## 2017-07-26 DIAGNOSIS — M5441 Lumbago with sciatica, right side: Secondary | ICD-10-CM

## 2017-07-26 DIAGNOSIS — M533 Sacrococcygeal disorders, not elsewhere classified: Secondary | ICD-10-CM

## 2017-07-26 NOTE — Therapy (Addendum)
[facility] [address] [place], [place], [address] Phone: [phone]   Fax:  [phone]  Physical Therapy Treatment  Patient Details  Name: Olivia Walter MRN: [id number] Date of Birth: 01/22/1986 Referring Provider: Dalbert GarnetBeasley  Encounter Date: 07/26/2017      PT End of Session - 07/26/17 1055    Visit Number 2   Number of Visits 12   Date for PT Re-Evaluation 10/04/17   PT Start Time 1010   PT Stop Time 1055   PT Time Calculation (min) 45 min      Past Medical History:  Diagnosis Date  . Asthma   . Kidney calculus   . Kidney stones   . Osteoarthritis   . Reactive hypoglycemia     Past Surgical History:  Procedure Laterality Date  . CAPSULE ENDOSCOPY    . COLONOSCOPY    . cyst removed on wrist    . ESOPHAGOGASTRODUODENOSCOPY    . ESOPHAGOGASTRODUODENOSCOPY (EGD) WITH PROPOFOL N/A 10/01/2016   Procedure: ESOPHAGOGASTRODUODENOSCOPY (EGD) WITH PROPOFOL;  Surgeon: Christena DeemMartin U Skulskie, MD;  [facility];  Service: Endoscopy;  Laterality: N/A;  . MOUTH SURGERY      There were no vitals filed for this visit.      Subjective Assessment - 07/26/17 1012    Subjective Pt reported she saw the urologist and one of her tests showed she was not emptying all the way. Pt is due for a CT scan next Friday 9/ 7/18. Pt states her burning sensation with urination is more cramping than burning. Sharp pains in the L abdomen have decreased by 50%.  The strong urine odor has changed over the past week with in only occuring in the morning.  Pt has started to drink more water to 2 _ 16 fl oz water and 24 fl oz of cranberry juice and water and ice when biking.  her low back pain in the L low back has been better. Today, pt feels a pain between shoulder blades as if the muscles are stuck in spasms.               O:  increased mm tightness in thoracic mm L > R   A: mm tensions decreased post Tx with less tingling down arm                              PT Long Term Goals - 07/23/17 1021      PT LONG TERM GOAL #1   Title Pt will decrease her  score on ODI    from 28% to < 25% in order to return to ADLs   Time 12   Period Weeks   Status New   Target Date 10/04/17     PT LONG TERM GOAL #2   Title Pt will demo IND with scoliosis HEP in order to decrease pain    Time 6   Period Months   Status New   Target Date 09/06/17     PT LONG TERM GOAL #3   Title Pt will be compliant with wearing heel lift in order to minimize pelvic obliquities and to minimize L abdominal pain   Time 12   Period Weeks   Status New     PT LONG TERM GOAL #4   Title Pt will report no sciatic pain after riding bike across 1 week in order to maintain health and wellness  Time 12   Period Weeks   Status New     PT LONG TERM GOAL #5   Title Pt will demo no lumbopelvic perturbation with deep core level 2-3 for 5 reps and proper pelvic floor ROM without tensions in order to optimize intraabdominal pressure system for postural stability and progression of fitness exercises   Time 12   Period Weeks   Status New     Additional Long Term Goals   Additional Long Term Goals Yes     PT LONG TERM GOAL #6   Title Pt will decrease her COREFO score from 31% to > 26% in order to restore bowel function   Time 12   Period Weeks   Status New   Target Date 10/04/17     PT LONG TERM GOAL #7   Title Pt will report decreased L abdominal pelvic pain by 50% in order to improve QOL   Time 12   Period Weeks   Status New   Target Date 10/04/17               Plan - 07/26/17 1055    Clinical Impression Statement Pt reported 75% less numbness in her L hand post Tx today. Address mm tightness along L thoracic spine where the concavity of her scoliosis occurs. Added stretches into HEP. Plan to continue addressing scoliosis to minimize risk for injuries and to progress towards deep core strengthening. Pt continues to  benefit from skilled PT.   Withholding pelvic floor assessment and Tx until after pt is cleared by her urologist. Pt is getting CT scan on 08/02/17 (next week). {Pt Pt remained complaint with drinking water and reports the pelvic pain decreased from burning to more of a cramping sensation. The sharp pains int he L LQ has decreased by 50% .     Rehab Potential Good   PT Frequency 1x / week   PT Duration 12 weeks   PT Treatment/Interventions ADLs/Self Care Home Management;Neuromuscular re-education;Manual techniques;Manual lymph drainage;Patient/family education;Therapeutic activities;Therapeutic exercise;Moist Heat;Scar mobilization;Energy conservation   Consulted and Agree with Plan of Care Patient      Patient will benefit from skilled therapeutic intervention in order to improve the following deficits and impairments:  Pain, Postural dysfunction, Increased muscle spasms, Impaired tone, Decreased mobility, Increased fascial restricitons, Improper body mechanics, Difficulty walking, Decreased safety awareness, Decreased balance, Decreased activity tolerance, Decreased range of motion, Decreased endurance, Decreased strength, Hypomobility, Decreased coordination, Decreased cognition  Visit Diagnosis: Other idiopathic scoliosis, cervicothoracic region  Sacrococcygeal disorders, not elsewhere classified  Myalgia  Chronic bilateral low back pain with bilateral sciatica     Problem List There are no active problems to display for this patient.   Mariane Masters ,PT, DPT, E-RYT  07/26/2017, 10:58 AM  [place] [facility] MAIN [facility] SERVICES [address] [place], [place], [address] Phone: [phone]   Fax:  [phone]  Name: Olivia Walter MRN: [id number] Date of Birth: [birth year]

## 2017-07-26 NOTE — Patient Instructions (Addendum)
3 way stretch by counter 5 breaths     childs pose rocking     hald L angle wing dragging on floor/bed   Or forearm glides by the wall  10 x       __________  Do these before and after biking and throughout the day

## 2017-07-30 ENCOUNTER — Telehealth: Payer: Self-pay | Admitting: Urology

## 2017-07-30 NOTE — Telephone Encounter (Signed)
Dr. Dayle PointsYeung from PT [phone]) called the office and left a voice mail message.  She is requesting clearance to begin PT with patient.  Patient is scheduled for a CT scan (9/7) and follow up in our office (9/10).  Patient states that her symptoms are improving.

## 2017-08-01 DIAGNOSIS — M2550 Pain in unspecified joint: Secondary | ICD-10-CM | POA: Insufficient documentation

## 2017-08-01 DIAGNOSIS — R700 Elevated erythrocyte sedimentation rate: Secondary | ICD-10-CM | POA: Insufficient documentation

## 2017-08-01 DIAGNOSIS — R091 Pleurisy: Secondary | ICD-10-CM | POA: Insufficient documentation

## 2017-08-01 DIAGNOSIS — R768 Other specified abnormal immunological findings in serum: Secondary | ICD-10-CM | POA: Insufficient documentation

## 2017-08-01 DIAGNOSIS — R21 Rash and other nonspecific skin eruption: Secondary | ICD-10-CM | POA: Insufficient documentation

## 2017-08-02 ENCOUNTER — Ambulatory Visit
Admission: RE | Admit: 2017-08-02 | Discharge: 2017-08-02 | Disposition: A | Discharge status: Home / Self Care | Payer: BLUE CROSS/BLUE SHIELD | Source: Ambulatory Visit | Attending: Urology | Admitting: Urology

## 2017-08-02 ENCOUNTER — Ambulatory Visit: Payer: BLUE CROSS/BLUE SHIELD | Attending: Obstetrics and Gynecology | Admitting: Physical Therapy

## 2017-08-02 DIAGNOSIS — G8929 Other chronic pain: Secondary | ICD-10-CM

## 2017-08-02 DIAGNOSIS — M5441 Lumbago with sciatica, right side: Secondary | ICD-10-CM | POA: Insufficient documentation

## 2017-08-02 DIAGNOSIS — M5442 Lumbago with sciatica, left side: Secondary | ICD-10-CM | POA: Diagnosis present

## 2017-08-02 DIAGNOSIS — R1032 Left lower quadrant pain: Secondary | ICD-10-CM | POA: Diagnosis not present

## 2017-08-02 DIAGNOSIS — M533 Sacrococcygeal disorders, not elsewhere classified: Secondary | ICD-10-CM

## 2017-08-02 DIAGNOSIS — M4123 Other idiopathic scoliosis, cervicothoracic region: Secondary | ICD-10-CM | POA: Diagnosis not present

## 2017-08-02 DIAGNOSIS — M791 Myalgia, unspecified site: Secondary | ICD-10-CM

## 2017-08-02 MED ORDER — IOPAMIDOL (ISOVUE-300) INJECTION 61%
100.0000 mL | Freq: Once | INTRAVENOUS | Status: AC | PRN
  Administered 2017-08-02: 100 mL via INTRAVENOUS

## 2017-08-02 NOTE — Therapy (Signed)
[facility] MAIN [facility] SERVICES [address] [place], [place], [address] Phone: [phone]   Fax:  [phone]  Physical Therapy Treatment  Patient Details  Name: Olivia Walter MRN: [id number] Date of Birth: 1986/04/05 Referring Provider: Dalbert Garnet  Encounter Date: 08/02/2017      PT End of Session - 08/02/17 1702    Visit Number 3   Number of Visits 12   Date for PT Re-Evaluation 10/04/17   PT Start Time 1000   PT Stop Time 1057   PT Time Calculation (min) 57 min      Past Medical History:  Diagnosis Date  . Asthma   . Kidney calculus   . Kidney stones   . Osteoarthritis   . Reactive hypoglycemia     Past Surgical History:  Procedure Laterality Date  . CAPSULE ENDOSCOPY    . COLONOSCOPY    . cyst removed on wrist    . ESOPHAGOGASTRODUODENOSCOPY    . ESOPHAGOGASTRODUODENOSCOPY (EGD) WITH PROPOFOL N/A 10/01/2016   Procedure: ESOPHAGOGASTRODUODENOSCOPY (EGD) WITH PROPOFOL;  Surgeon: Christena Deem, MD;  Location: [facility] ENDOSCOPY;  Service: Endoscopy;  Laterality: N/A;  . MOUTH SURGERY      There were no vitals filed for this visit.      Subjective Assessment - 08/02/17 1007    Subjective Pt reported her headaches have improved. She has not had any migraines last week.  Pt's shoulder and neck exercises have helped the the area between the shoulder blades to be less. Pt will be undergoing CT scan this afternoon regarding her lower abdominal pain. For one week, pt had less abdominal pain but the past 2 days, it has gotten worse with an aching, cramping feeling all day.  Bowel movements are normal. no blood in urine. stool, fevers, sweats, chills.             [facility] PT Assessment - 08/02/17 1042      Palpation   Spinal mobility tightness and tenderness at T3, and lower cervical L> R   Palpation comment less tightness at                     [facility] Adult PT Treatment/Exercise - 08/02/17 1659      Manual Therapy   Manual therapy comments STM along interspinals L T3, traction at occiput, lower cervical       Moist heat under neck 6' while pt performing deep core level 2, new exercises ( shoulde/ cervicalr retraction)           PT Education - 08/02/17 1702    Education provided Yes   Education Details HEP   Person(s) Educated Patient   Methods Explanation;Demonstration;Tactile cues;Verbal cues;Handout   Comprehension Verbal cues required;Returned demonstration;Verbalized understanding             PT Long Term Goals - 07/23/17 1021      PT LONG TERM GOAL #1   Title Pt will decrease her  score on ODI    from 28% to < 25% in order to return to ADLs   Time 12   Period Weeks   Status New   Target Date 10/04/17     PT LONG TERM GOAL #2   Title Pt will demo IND with scoliosis HEP in order to decrease pain    Time 6   Period Months   Status New   Target Date 09/06/17     PT LONG TERM GOAL #3   Title Pt will  be compliant with wearing heel lift in order to minimize pelvic obliquities and to minimize L abdominal pain   Time 12   Period Weeks   Status New     PT LONG TERM GOAL #4   Title Pt will report no sciatic pain after riding bike across 1 week in order to maintain health and wellness   Time 12   Period Weeks   Status New     PT LONG TERM GOAL #5   Title Pt will demo no lumbopelvic perturbation with deep core level 2-3 for 5 reps and proper pelvic floor ROM without tensions in order to optimize intraabdominal pressure system for postural stability and progression of fitness exercises   Time 12   Period Weeks   Status New     Additional Long Term Goals   Additional Long Term Goals Yes     PT LONG TERM GOAL #6   Title Pt will decrease her COREFO score from 31% to > 26% in order to restore bowel function   Time 12   Period Weeks   Status New   Target Date 10/04/17     PT LONG TERM GOAL #7   Title Pt will report decreased L abdominal pelvic pain by 50% in order to  improve QOL   Time 12   Period Weeks   Status New   Target Date 10/04/17               Plan - 08/02/17 1708    Clinical Impression Statement Pt reports her numbness and tingling of arm has imprved since last session.  Pt also has had no migraines last week. Addressed lower cervical and upper thoracic mm balance 2/2 to scoliosis. Pt demo'd less pain and mm tightness and was able to coordinate scapular stabilizer mm with less upper trap overactivity. Plan to continue address pt's scoliosis and forward head posture at future sessions.  Plan to withhold pelvic assessment/ Tx for her low abdominal pain until pt has been cleared by her urologist because today pt reports her pain has returned and worsened the past 2 days.  Pt is undergoing a CT scan following today's session. Pt continues to benefit from skilled PT.      Rehab Potential Good   PT Frequency 1x / week   PT Duration 12 weeks   PT Treatment/Interventions ADLs/Self Care Home Management;Neuromuscular re-education;Manual techniques;Manual lymph drainage;Patient/family education;Therapeutic activities;Therapeutic exercise;Moist Heat;Scar mobilization;Energy conservation   Consulted and Agree with Plan of Care Patient      Patient will benefit from skilled therapeutic intervention in order to improve the following deficits and impairments:  Pain, Postural dysfunction, Increased muscle spasms, Impaired tone, Decreased mobility, Increased fascial restricitons, Improper body mechanics, Difficulty walking, Decreased safety awareness, Decreased balance, Decreased activity tolerance, Decreased range of motion, Decreased endurance, Decreased strength, Hypomobility, Decreased coordination, Decreased cognition  Visit Diagnosis: Other idiopathic scoliosis, cervicothoracic region  Sacrococcygeal disorders, not elsewhere classified  Myalgia  Chronic bilateral low back pain with bilateral sciatica     Problem List There are no active  problems to display for this patient.   Mariane MastersYeung,Shin Yiing ,PT, DPT, E-RYT  08/02/2017, 5:08 PM  [facility] [address] [place], [place], [address] Phone: [phone]   Fax:  [phone]  Name: Olivia LeatherwoodMelanie Walter MRN: [id number] Date of Birth: 04/14/1986

## 2017-08-02 NOTE — Patient Instructions (Addendum)
band under heels while laying on back w/ knees bent  "W" exercise  10 reps x 2 sets   Band is placed under feet, knees bent, feet are hip width apart Hold band with thumbs point out, keep upper arm and elbow touching the bed the whole time  - inhale and then exhale pull bands by bending elbows hands move in a "w"  (feel shoulder blades squeezing)   __________   Shoulder roll backwards x 3 x and 5 sec holds with shoulder and head press  10 reps x 2     __________  Wall: knees bent,  5 sec holds with shoulder and head , elbow press  10 reps x 2

## 2017-08-04 NOTE — Progress Notes (Signed)
08/05/2017 10:08 AM   Olivia Walter September 07, 1986 [id number]  Referring provider: Alba Cory, MD [address] [place], [place] [address]  Chief Complaint  Patient presents with  . Results    CT    HPI: 31 yo WF with left lower quadrant pain, feelings of incomplete bladder emptying and malodorous urine who presents today to review her CT scan results.     Background history Patient is a 50 -year-old Caucasian female who presents today as a referral from their OBGYN, Dr. Dalbert Garnet, for hematuria.  Patient was found to positive dip for hematuria on UA's.   She states she had blood in urine in the past due to stones.   She does not have a prior history of recurrent urinary tract infections, trauma to the genitourinary tract or malignancies of the genitourinary tract.  She does have a family medical history of nephrolithiasis (mother and sister),  But she does not have a family history of  malignancies of the genitourinary tract or hematuria.  Today, she is having symptoms of nocturia x 1.  This is baseline.  Her UA today is positive for blood on dip, but microscopically negative.  She states that she is experiencing suprapubic pain that feels like severe menstrual cramps.   This is been occurring for one month.  BM's makes the pain worse.  Heating pad eases the pain.  No radiation.  The pain can last a couple minutes to a few hours. She has not had abdominal pain or flank pain.  She denies any recent fevers, chills, nausea or vomiting.  She had a contrast study in 2017 and no worrisome GU findings were discovered. I have independently reviewed the films.  She is a former smoker, with a 2 ppd history.  Quit on 11/17/2016.  She is not exposed to secondhand smoke.  She does not work with chemicals.  She has a high BMI.   She is experiencing constipation.   She states her sister and mother are hypochondriacs.    CT Renal stone study performed on 04/01/2017 noted no acute findings in the  abdomen or pelvis. Specifically, no findings to explain the patient's history of left flank pain.  She presented on 07/22/2017 as a referral from Dr. Evette Cristal- Parke Simmers for sharp pains, bladder fullness, incomplete emptying and a foul odor to her urine.  She was been experiencing urgency x 0-3, frequency x 8 or more, not restricting fluids to avoid visits to the restroom, is engaging in toilet mapping, incontinence x 0-3 and nocturia x 0-3.  Her PVR is 269 mL.   Her UA is negative.  She states it is painful to urinate.  She also started to have constant pain in the LLQ.  She describes the pain as crampy with random stabbing pains. The crampy pain is constant.  The stabbing pain is intermittent lasting for a few hours.  8- to "made her feels like she was going to pass out"/10 pain.  Nothing makes the pain worse.  Heating pad helped a little bit.   BM's are regular.  She denies gross hematuria and dysuria.  She has an ache after she urinates.  She has not had fevers, chills and vomiting.  She is having nausea with the stabbing pain.  Her UA was negative.  She is also having urgency.    CT with contrast completed on 08/02/2017 found no acute findings are noted in the abdomen or pelvis to account for the patient's symptoms.  Today, she is complaining of frequency, pain with urination and nocturia. Her PVR is 19 mL.  She is drinking three 16 ounces of water daily.  She is also drinking cranberry juice.      PMH: Past Medical History:  Diagnosis Date  . Asthma   . Kidney calculus   . Kidney stones   . Osteoarthritis   . Reactive hypoglycemia     Surgical History: Past Surgical History:  Procedure Laterality Date  . CAPSULE ENDOSCOPY    . COLONOSCOPY    . cyst removed on wrist    . ESOPHAGOGASTRODUODENOSCOPY    . ESOPHAGOGASTRODUODENOSCOPY (EGD) WITH PROPOFOL N/A 10/01/2016   Procedure: ESOPHAGOGASTRODUODENOSCOPY (EGD) WITH PROPOFOL;  Surgeon: Christena Deem, MD;  Location: [facility] ENDOSCOPY;   Service: Endoscopy;  Laterality: N/A;  . MOUTH SURGERY      Home Medications:  Allergies as of 08/05/2017      Reactions   Bactrim [sulfamethoxazole-trimethoprim]    Bee Venom    Clindamycin/lincomycin    Penicillins       Medication List       Accurate as of 08/05/17 10:08 AM. Always use your most recent med list.          aspirin-acetaminophen-caffeine 250-250-65 MG tablet Commonly known as:  EXCEDRIN MIGRAINE Take by mouth every 6 (six) hours as needed for headache.   clindamycin 1 % external solution Commonly known as:  CLEOCIN T Apply topically 2 (two) times daily to the affected areas.   cyclobenzaprine 10 MG tablet Commonly known as:  FLEXERIL Take 10 mg by mouth as needed for muscle spasms.   dexamethasone 1 MG tablet Commonly known as:  DECADRON Take 1 tablet (1 mg total) by mouth once for 1 dose at 11 PM the night before your lab test.   DEXILANT 60 MG capsule Generic drug:  dexlansoprazole Dexilant 60 mg capsule, delayed release   ibuprofen 800 MG tablet Commonly known as:  ADVIL,MOTRIN Take 800 mg by mouth every 8 (eight) hours as needed.   ketorolac 10 MG tablet Commonly known as:  TORADOL Take 1 tablet (10 mg total) by mouth every 6 (six) hours as needed.   MAGNESIUM-POTASSIUM PO Take by mouth.   medroxyPROGESTERone 150 MG/ML injection Commonly known as:  DEPO-PROVERA Inject 150 mg into the muscle every 3 (three) months.   multivitamins with iron Tabs tablet Take 1 tablet by mouth daily.   ONE-A-DAY 55 PLUS PO Take by mouth.   pantoprazole 40 MG tablet Commonly known as:  PROTONIX Take 40 mg by mouth 2 (two) times daily before a meal.   SUMAtriptan 6 MG/0.5ML Soaj sumatriptan 6 mg/0.5 mL subcutaneous pen injector            Discharge Care Instructions        Start     Ordered   08/05/17 0000  BLADDER SCAN AMB NON-IMAGING     08/05/17 0949      Allergies:  Allergies  Allergen Reactions  . Bactrim  [Sulfamethoxazole-Trimethoprim]   . Bee Venom   . Clindamycin/Lincomycin   . Penicillins     Family History: Family History  Problem Relation Age of Onset  . Prostate cancer Neg Hx   . Kidney cancer Neg Hx   . Bladder Cancer Neg Hx     Social History:  reports that she quit smoking about 9 months ago. She has never used smokeless tobacco. She reports that she does not drink alcohol or use drugs.  ROS: UROLOGY Frequent Urination?:  Yes Hard to postpone urination?: No Burning/pain with urination?: Yes Get up at night to urinate?: Yes Leakage of urine?: No Urine stream starts and stops?: No Trouble starting stream?: No Do you have to strain to urinate?: No Blood in urine?: No Urinary tract infection?: No Sexually transmitted disease?: No Injury to kidneys or bladder?: No Painful intercourse?: No Weak stream?: No Currently pregnant?: No Vaginal bleeding?: No Last menstrual period?: n  Gastrointestinal Nausea?: No Vomiting?: No Indigestion/heartburn?: No Diarrhea?: No Constipation?: No  Constitutional Fever: No Night sweats?: No Weight loss?: No Fatigue?: Yes  Skin Skin rash/lesions?: No Itching?: No  Eyes Blurred vision?: No Double vision?: No  Ears/Nose/Throat Sore throat?: No Sinus problems?: No  Hematologic/Lymphatic Swollen glands?: No Easy bruising?: No  Cardiovascular Leg swelling?: No Chest pain?: No  Respiratory Cough?: No Shortness of breath?: No  Endocrine Excessive thirst?: No  Musculoskeletal Back pain?: Yes Joint pain?: Yes  Neurological Headaches?: Yes Dizziness?: No  Psychologic Depression?: No Anxiety?: No  Physical Exam: BP 114/77   Pulse 98   Ht 5\' 3"  (1.6 m)   Wt 186 lb 6.4 oz (84.6 kg)   BMI 33.02 kg/m   Constitutional: Well nourished. Alert and oriented, No acute distress. HEENT: [place] AT, moist mucus membranes. Trachea midline, no masses. Cardiovascular: No clubbing, cyanosis, or edema. Respiratory:  Normal respiratory effort, no increased work of breathing. Skin: No rashes, bruises or suspicious lesions. Lymph: No cervical or inguinal adenopathy. Neurologic: Grossly intact, no focal deficits, moving all 4 extremities. Psychiatric: Normal mood and affect.  Laboratory Data: Urinalysis Negative.  See EPIC.     Pertinent Imaging: CLINICAL DATA:  31 year old female with history of severe left lower quadrant abdominal pain and pelvic pain for the past several months intermittently. History of kidney stones. Cramping while urinating. Frequent urination.  EXAM: CT ABDOMEN AND PELVIS WITH CONTRAST  TECHNIQUE: Multidetector CT imaging of the abdomen and pelvis was performed using the standard protocol following bolus administration of intravenous contrast.  CONTRAST:  100mL ISOVUE-300 IOPAMIDOL (ISOVUE-300) INJECTION 61%  COMPARISON:  CT the abdomen and pelvis 04/01/2017.  FINDINGS: Lower chest: Unremarkable.  Hepatobiliary: No cystic or solid hepatic lesions. No intra or extrahepatic biliary ductal dilatation. Gallbladder is normal in appearance.  Pancreas: No pancreatic mass. No pancreatic ductal dilatation. No pancreatic or peripancreatic fluid or inflammatory changes.  Spleen: Unremarkable.  Adrenals/Urinary Tract: Bilateral kidneys and bilateral adrenal glands are normal in appearance. No hydroureteronephrosis. No definite urinary tract calculi are noted on today's contrast enhanced CT examination. Urinary bladder is normal in appearance.  Stomach/Bowel: Normal appearance of the stomach. No pathologic dilatation of small bowel or colon. Normal appendix.  Vascular/Lymphatic: No significant atherosclerotic disease, aneurysm or dissection noted in the abdominal or pelvic vasculature. No lymphadenopathy noted in the abdomen or pelvis.  Reproductive: Uterus and ovaries are unremarkable in appearance.  Other: No significant volume of ascites.  No  pneumoperitoneum.  Musculoskeletal: There are no aggressive appearing lytic or blastic lesions noted in the visualized portions of the skeleton.  IMPRESSION: 1. No acute findings are noted in the abdomen or pelvis to account for the patient's symptoms.   Electronically Signed   By: Trudie Reedaniel  Entrikin M.D.   On: 08/02/2017 15:20  I have independently reviewed the films  Assessment & Plan:    1. Left lower quadrant pain  - no etiology seen on contrast CT or non contrast CT   - currently resolving  - return to PT  2. Feelings of incomplete emptying  -  PVR is 16 mL  - currently resolved  - return to PT  3. Malodorous urine  - UA is clear - no findings on CT  - encouraged to increase fluids   Return for return to PT.  These notes generated with voice recognition software. I apologize for typographical errors.  Michiel Cowboy, PA-C  [facility] [address] [place], [place] [address] [phone]

## 2017-08-05 ENCOUNTER — Encounter: Payer: Self-pay | Admitting: Urology

## 2017-08-05 ENCOUNTER — Ambulatory Visit (INDEPENDENT_AMBULATORY_CARE_PROVIDER_SITE_OTHER): Payer: BLUE CROSS/BLUE SHIELD | Admitting: Urology

## 2017-08-05 VITALS — BP 114/77 | HR 98 | Ht 63.0 in | Wt 186.4 lb

## 2017-08-05 DIAGNOSIS — R1032 Left lower quadrant pain: Secondary | ICD-10-CM

## 2017-08-05 DIAGNOSIS — R8290 Unspecified abnormal findings in urine: Secondary | ICD-10-CM | POA: Diagnosis not present

## 2017-08-05 DIAGNOSIS — R3914 Feeling of incomplete bladder emptying: Secondary | ICD-10-CM | POA: Diagnosis not present

## 2017-08-05 LAB — URINALYSIS, COMPLETE
BILIRUBIN UA: NEGATIVE
Glucose, UA: NEGATIVE
KETONES UA: NEGATIVE
LEUKOCYTES UA: NEGATIVE
NITRITE UA: NEGATIVE
PROTEIN UA: NEGATIVE
Specific Gravity, UA: <1.005 — ABNORMAL LOW (ref 1.005–1.030)
Urobilinogen, Ur: 0.2 mg/dL (ref 0.2–1.0)
pH, UA: 6.5 (ref 5.0–7.5)

## 2017-08-05 LAB — BLADDER SCAN AMB NON-IMAGING: SCAN RESULT: 19

## 2017-08-06 NOTE — Telephone Encounter (Signed)
She can resume PT.

## 2017-08-07 NOTE — Telephone Encounter (Signed)
Dr. Dayle PointsYeung was notified

## 2017-08-16 ENCOUNTER — Ambulatory Visit: Payer: BLUE CROSS/BLUE SHIELD | Admitting: Physical Therapy

## 2017-08-16 DIAGNOSIS — M4123 Other idiopathic scoliosis, cervicothoracic region: Secondary | ICD-10-CM | POA: Diagnosis not present

## 2017-08-16 DIAGNOSIS — G8929 Other chronic pain: Secondary | ICD-10-CM

## 2017-08-16 DIAGNOSIS — M5441 Lumbago with sciatica, right side: Secondary | ICD-10-CM

## 2017-08-16 DIAGNOSIS — M533 Sacrococcygeal disorders, not elsewhere classified: Secondary | ICD-10-CM

## 2017-08-16 DIAGNOSIS — M791 Myalgia, unspecified site: Secondary | ICD-10-CM

## 2017-08-16 DIAGNOSIS — M5442 Lumbago with sciatica, left side: Secondary | ICD-10-CM

## 2017-08-16 NOTE — Therapy (Signed)
[place] [facility] MAIN [facility] SERVICES [address] [place], [place], [address] Phone: [phone]   Fax:  [phone]  Physical Therapy Treatment  Patient Details  Name: Olivia Walter MRN: [id number] Date of Birth: August 18, 1986 Referring Provider: Dalbert Garnet  Encounter Date: 08/16/2017      PT End of Session - 08/16/17 2346    Visit Number 4   Number of Visits 12   Date for PT Re-Evaluation 10/04/17   PT Start Time 1010   PT Stop Time 1107   PT Time Calculation (min) 57 min      Past Medical History:  Diagnosis Date  . Asthma   . Kidney calculus   . Kidney stones   . Osteoarthritis   . Reactive hypoglycemia     Past Surgical History:  Procedure Laterality Date  . CAPSULE ENDOSCOPY    . COLONOSCOPY    . cyst removed on wrist    . ESOPHAGOGASTRODUODENOSCOPY    . ESOPHAGOGASTRODUODENOSCOPY (EGD) WITH PROPOFOL N/A 10/01/2016   Procedure: ESOPHAGOGASTRODUODENOSCOPY (EGD) WITH PROPOFOL;  Surgeon: Christena Deem, MD;  Location: [facility] ENDOSCOPY;  Service: Endoscopy;  Laterality: N/A;  . MOUTH SURGERY      There were no vitals filed for this visit.      Subjective Assessment - 08/16/17 1015    Subjective Pt reported is spotting today with menstrual blood and she thinks it may have to do with her being due for her brithcontrol shot. Pt had only 2-3 episodes of abdominal cramps across the past 2 weeks up until yesterday. They lasted shorter amount of time than before.  The last epsiode felt more like stabbing pain in the L pubic area like "someone is punching her" while the previous times, the pain has been more a cramping pain.               [facility] PT Assessment - 08/16/17 2237      Observation/Other Assessments   Observations decreased upper trap mm / spinal mm tightness      Single Leg Stance   Comments R SLS with difficulty, LOB, trendelenberg     Palpation   Palpation comment tensions at L cocygeus/ sacrotuberosus ligament                    Pelvic Floor Special Questions - 08/16/17 2237    Pelvic Floor Internal Exam pt consented verbally without contraindications after being explained the details of the exam   Exam Type Vaginal   Palpation increased mm tightness and tenderness of mm attached to posterior to pubic symphysis/ tubercle, ischiocavernosus/ puborectalis mm B ,  iliococcygeus L > R             [facility] Adult PT Treatment/Exercise - 08/16/17 2332      Manual Therapy   Manual therapy comments long axis distraction, sidelying: MWM hip abd to mobilize SIJ L , STM at coccygeus externally    Internal Pelvic Floor STM, thiele massage over the areas indicated in assessments     Therapeutic Ex: and Neuroreeducation: see pt instructions           PT Education - 08/16/17 2346    Education provided Yes   Education Details HEP   Person(s) Educated Patient   Methods Explanation;Demonstration;Tactile cues;Verbal cues;Handout   Comprehension Returned demonstration;Verbalized understanding             PT Long Term Goals - 08/16/17 2352      PT LONG TERM GOAL #1  Title Pt will decrease her  score on ODI    from 28% to < 25% in order to return to ADLs   Time 12   Period Weeks   Status On-going     PT LONG TERM GOAL #2   Title Pt will demo IND with scoliosis HEP in order to decrease pain    Time 6   Period Months   Status On-going     PT LONG TERM GOAL #3   Title Pt will be compliant with wearing heel lift in order to minimize pelvic obliquities and to minimize L abdominal pain   Time 12   Period Weeks   Status On-going     PT LONG TERM GOAL #4   Title Pt will report no sciatic pain after riding bike across 1 week in order to maintain health and wellness   Time 12   Period Weeks   Status On-going     PT LONG TERM GOAL #5   Title Pt will demo no lumbopelvic perturbation with deep core level 2-3 for 5 reps and proper pelvic floor ROM without tensions in order to optimize  intraabdominal pressure system for postural stability and progression of fitness exercises   Time 12   Period Weeks   Status On-going     Additional Long Term Goals   Additional Long Term Goals Yes     PT LONG TERM GOAL #6   Title Pt will decrease her COREFO score from 31% to > 26% in order to restore bowel function   Time 12   Period Weeks   Status On-going     PT LONG TERM GOAL #7   Title Pt will report decreased L abdominal pelvic pain by 50% in order to improve QOL   Time 12   Period Weeks   Status On-going     PT LONG TERM GOAL #8   Title Pt will demo no mm tensions/ tenderness posterior to pubic symphysis and tubercle B in order to promote urinary function and decrease pain   Time 12   Period Weeks   Status New               Plan - 08/16/17 1019    Clinical Impression Statement PT received notice from urology office that pt 's CT scans were negative and pt has been cleared to proceed with pelvic foor assessment.   Today, pt demo'd increased  pelvic floor mm tensions located posterior to pubic symphysis/ tubercle when 1-2nd layer of mm attach related to urinary function. Pt also had tensions at iliococcygeus .  Pt demo'd less tenderness and tensions post Tx. Pt 's L SIJ mobility also improved with manual Tx. Pt demo'd improved spinal and upper trap mm tensions and pelvic girdle alignment since last session. Pt's report that her abdominal pain are decreasing in duration. Anticipate today's internal manual Tx will continue help pt improve her pelvic floor function and decrease her pain. Suspect pt's pelvic obliquities,SIJ dysfunction, and L pelvic floor tightness may be related to pt's Hx of wearing a boot for 1 year 2/2 Achilles Tendon injury.  Pt continues to benefit from skilled PT.      Rehab Potential Good   PT Frequency 1x / week   PT Duration 12 weeks   PT Treatment/Interventions ADLs/Self Care Home Management;Neuromuscular re-education;Manual techniques;Manual lymph  drainage;Patient/family education;Therapeutic activities;Therapeutic exercise;Moist Heat;Scar mobilization;Energy conservation   Consulted and Agree with Plan of Care Patient      Patient will benefit  from skilled therapeutic intervention in order to improve the following deficits and impairments:  Pain, Postural dysfunction, Increased muscle spasms, Impaired tone, Decreased mobility, Increased fascial restricitons, Improper body mechanics, Difficulty walking, Decreased safety awareness, Decreased balance, Decreased activity tolerance, Decreased range of motion, Decreased endurance, Decreased strength, Hypomobility, Decreased coordination, Decreased cognition  Visit Diagnosis: Other idiopathic scoliosis, cervicothoracic region  Sacrococcygeal disorders, not elsewhere classified  Myalgia  Chronic bilateral low back pain with bilateral sciatica     Problem List There are no active problems to display for this patient.   Mariane Masters ,PT, DPT, E-RYT  08/16/2017, 11:54 PM  [place] [facility] MAIN [facility] SERVICES [address] [place], [place], [address] Phone: [phone]   Fax:  [phone]  Name: Viana Sleep MRN: [id number] Date of Birth: [birth year]

## 2017-08-16 NOTE — Patient Instructions (Signed)
Deep core with awareness of pelvic floor lengthening on inhalation and movement on exhaltion  Seated figure 4 on L   Standing figure 4- backward tap on both legs 10 x 3

## 2017-08-30 ENCOUNTER — Ambulatory Visit: Payer: BLUE CROSS/BLUE SHIELD | Attending: Obstetrics and Gynecology | Admitting: Physical Therapy

## 2017-08-30 DIAGNOSIS — M533 Sacrococcygeal disorders, not elsewhere classified: Secondary | ICD-10-CM | POA: Diagnosis present

## 2017-08-30 DIAGNOSIS — G8929 Other chronic pain: Secondary | ICD-10-CM

## 2017-08-30 DIAGNOSIS — M4123 Other idiopathic scoliosis, cervicothoracic region: Secondary | ICD-10-CM | POA: Insufficient documentation

## 2017-08-30 DIAGNOSIS — M5441 Lumbago with sciatica, right side: Secondary | ICD-10-CM | POA: Insufficient documentation

## 2017-08-30 DIAGNOSIS — M791 Myalgia, unspecified site: Secondary | ICD-10-CM | POA: Diagnosis present

## 2017-08-30 DIAGNOSIS — M5442 Lumbago with sciatica, left side: Secondary | ICD-10-CM | POA: Diagnosis present

## 2017-08-30 NOTE — Therapy (Signed)
[place] [facility] MAIN [facility] SERVICES [address] [place], [place], [address] Phone: [phone]   Fax:  [phone]  Physical Therapy Treatment  Patient Details  Name: Olivia Walter MRN: [id number] Date of Birth: 02-Dec-1985 Referring Provider: Dalbert Garnet  Encounter Date: 08/30/2017      PT End of Session - 08/30/17 0844    Visit Number 5   Number of Visits 12   Date for PT Re-Evaluation 10/04/17   PT Start Time 0804   PT Stop Time 0900   PT Time Calculation (min) 56 min   Activity Tolerance Patient tolerated treatment well;No increased pain   Behavior During Therapy WFL for tasks assessed/performed      Past Medical History:  Diagnosis Date  . Asthma   . Kidney calculus   . Kidney stones   . Osteoarthritis   . Reactive hypoglycemia     Past Surgical History:  Procedure Laterality Date  . CAPSULE ENDOSCOPY    . COLONOSCOPY    . cyst removed on wrist    . ESOPHAGOGASTRODUODENOSCOPY    . ESOPHAGOGASTRODUODENOSCOPY (EGD) WITH PROPOFOL N/A 10/01/2016   Procedure: ESOPHAGOGASTRODUODENOSCOPY (EGD) WITH PROPOFOL;  Surgeon: Christena Deem, MD;  Location: [facility] ENDOSCOPY;  Service: Endoscopy;  Laterality: N/A;  . MOUTH SURGERY      There were no vitals filed for this visit.      Subjective Assessment - 08/30/17 0810    Subjective Pt reports her L low back area does not bother her anymore but she felt sore for a couple of days where the Tx was at her last session. Pt showed PT a picture of a bruise in the area which faded after a few days. Pt feels more L knee issues currently. There is no change to the pelvic and low abdominal area.              [facility] PT Assessment - 08/30/17 0843      Coordination   Gross Motor Movements are Fluid and Coordinated --  improved coordination with breathing and pelvic floor      Palpation   Palpation comment no tendenress at L coccygeus.  R SIJ slightly limited > L. Post Tx: improved.                     Pelvic Floor Special Questions - 08/30/17 0839    Prolapse Anterior Wall  just below pubic symphysis,   Prolapse other  pillow under hips, PFM training faciliated a more cranial position   Pelvic Floor Internal Exam pt consented verbally without contraindications after being explained the details of the exam   Exam Type Vaginal   Palpation No mm tensions at previous tight areas behind pubic symphysis, L side mm without activation on cue for contraction ( improved post Tx)    Strength fair squeeze, definite lift   Strength # of reps 10   Strength # of seconds 1           [facility] Adult PT Treatment/Exercise - 08/30/17 1884      Neuro Re-ed    Neuro Re-ed Details  pelvic tilt, co-activation of feet to faciliate a stronger pelvic floor contraction. elevated hips to promote more cranial position of bladder      Manual Therapy   Internal Pelvic Floor thiele massage on L to faciliate activation                 PT Education - 08/30/17 0906    Education  provided Yes   Education Details HEP and pain science education   Person(s) Educated Patient   Methods Explanation;Demonstration;Tactile cues;Verbal cues;Handout   Comprehension Returned demonstration;Verbalized understanding             PT Long Term Goals - 08/16/17 2352      PT LONG TERM GOAL #1   Title Pt will decrease her  score on ODI    from 28% to < 25% in order to return to ADLs   Time 12   Period Weeks   Status On-going     PT LONG TERM GOAL #2   Title Pt will demo IND with scoliosis HEP in order to decrease pain    Time 6   Period Months   Status On-going     PT LONG TERM GOAL #3   Title Pt will be compliant with wearing heel lift in order to minimize pelvic obliquities and to minimize L abdominal pain   Time 12   Period Weeks   Status On-going     PT LONG TERM GOAL #4   Title Pt will report no sciatic pain after riding bike across 1 week in order to maintain health and  wellness   Time 12   Period Weeks   Status On-going     PT LONG TERM GOAL #5   Title Pt will demo no lumbopelvic perturbation with deep core level 2-3 for 5 reps and proper pelvic floor ROM without tensions in order to optimize intraabdominal pressure system for postural stability and progression of fitness exercises   Time 12   Period Weeks   Status On-going     Additional Long Term Goals   Additional Long Term Goals Yes     PT LONG TERM GOAL #6   Title Pt will decrease her COREFO score from 31% to > 26% in order to restore bowel function   Time 12   Period Weeks   Status On-going     PT LONG TERM GOAL #7   Title Pt will report decreased L abdominal pelvic pain by 50% in order to improve QOL   Time 12   Period Weeks   Status On-going     PT LONG TERM GOAL #8   Title Pt will demo no mm tensions/ tenderness posterior to pubic symphysis and tubercle B in order to promote urinary function and decrease pain   Time 12   Period Weeks   Status New               Plan - 08/30/17 0849    Clinical Impression Statement Pt demo'd significantly decreased anterior pelvic floor mm compared to previous session. Pt required manual and neuro-muscular re-education to elicit a Grade 3/5 contraction. Pt's L sided muscles were initially not activating but improved post Tx. Pt required co-activation of feet to elicit a stronger contraction of pelvic floor muscles. Pt was provided pain science and nervous system education and shown gentle abdominal massage to minimize abdominal cramping which pt reported today. Pt reported she has some days when her cramping comes on and some days it does not. Plan to apply light manual Tx at future sessions because pt reported bruising (showed with a picture on her phone)  on low back area where PT applied soft tissue mobilization at previous session. The bruise and soreness disappeared after a few days of the Tx.   Pt is progressing well with skilled PT.      Rehab Potential Good   PT  Frequency 1x / week   PT Duration 12 weeks   PT Treatment/Interventions ADLs/Self Care Home Management;Neuromuscular re-education;Manual techniques;Manual lymph drainage;Patient/family education;Therapeutic activities;Therapeutic exercise;Moist Heat;Scar mobilization;Energy conservation   Consulted and Agree with Plan of Care Patient      Patient will benefit from skilled therapeutic intervention in order to improve the following deficits and impairments:  Pain, Postural dysfunction, Increased muscle spasms, Impaired tone, Decreased mobility, Increased fascial restricitons, Improper body mechanics, Difficulty walking, Decreased safety awareness, Decreased balance, Decreased activity tolerance, Decreased range of motion, Decreased endurance, Decreased strength, Hypomobility, Decreased coordination, Decreased cognition  Visit Diagnosis: No diagnosis found.     Problem List There are no active problems to display for this patient.   Mariane Masters ,PT, DPT, E-RYT  08/30/2017, 6:50 PM  [place] [facility] MAIN [facility] SERVICES [address] [place], [place], [address] Phone: [phone]   Fax:  [phone]  Name: Olivia Walter MRN: [id number] Date of Birth: 10/13/1986

## 2017-08-30 NOTE — Patient Instructions (Addendum)
Pelvic tilt     Deep core 1  ( with pillow under hips and make sure all four corners of the feet are grounded , knees are aligned)  Inhale, expansion ofp elvic floor Exhale, belly sink, pelvic floor lift, quick squeeze   10 reps     Deep core 2 ( with pillow under hips and make sure all four corners of the feet are grounded , knees are aligned)     Standing heel raises single leg 10 reps x 3x day     ___  Ease abdominal cramping: relax onto support behind you ( pillow placed ), feet onto ground and soft light rub over R lower to upper R belly to upper L then down lower L  5 reps   Packet on pain science

## 2017-09-13 ENCOUNTER — Ambulatory Visit: Payer: BLUE CROSS/BLUE SHIELD | Admitting: Physical Therapy

## 2017-09-13 DIAGNOSIS — M5442 Lumbago with sciatica, left side: Secondary | ICD-10-CM

## 2017-09-13 DIAGNOSIS — M4123 Other idiopathic scoliosis, cervicothoracic region: Secondary | ICD-10-CM

## 2017-09-13 DIAGNOSIS — M533 Sacrococcygeal disorders, not elsewhere classified: Secondary | ICD-10-CM

## 2017-09-13 DIAGNOSIS — G8929 Other chronic pain: Secondary | ICD-10-CM

## 2017-09-13 DIAGNOSIS — M791 Myalgia, unspecified site: Secondary | ICD-10-CM

## 2017-09-13 DIAGNOSIS — M5441 Lumbago with sciatica, right side: Secondary | ICD-10-CM

## 2017-09-13 NOTE — Therapy (Signed)
[place] [facility] MAIN [facility] SERVICES [address] [place], [place], [address] Phone: [phone]   Fax:  [phone]  Physical Therapy Treatment  Patient Details  Name: Olivia Walter MRN: [id number] Date of Birth: [birth year] Referring Provider: Dalbert Garnet  Encounter Date: 09/13/2017      PT End of Session - 09/13/17 1055    Visit Number 6   Number of Visits 12   Date for PT Re-Evaluation 10/04/17   PT Start Time 1004   PT Stop Time 1055   PT Time Calculation (min) 51 min   Activity Tolerance Patient tolerated treatment well;No increased pain   Behavior During Therapy WFL for tasks assessed/performed      Past Medical History:  Diagnosis Date  . Asthma   . Kidney calculus   . Kidney stones   . Osteoarthritis   . Reactive hypoglycemia     Past Surgical History:  Procedure Laterality Date  . CAPSULE ENDOSCOPY    . COLONOSCOPY    . cyst removed on wrist    . ESOPHAGOGASTRODUODENOSCOPY    . ESOPHAGOGASTRODUODENOSCOPY (EGD) WITH PROPOFOL N/A 10/01/2016   Procedure: ESOPHAGOGASTRODUODENOSCOPY (EGD) WITH PROPOFOL;  Surgeon: Christena Deem, MD;  Location: [facility] ENDOSCOPY;  Service: Endoscopy;  Laterality: N/A;  . MOUTH SURGERY      There were no vitals filed for this visit.      Subjective Assessment - 09/13/17 1007    Subjective Pt reported her L pubic pain was 1-2/10 for 2 weeks but it returned for the past 2 weeks. Pt woke up one morning and there it was. Pt had no increased activities the previous days. It feels the same as an ache with occassional shooting pain in any position.   Diarrhea also returned the past 2 weeks when prior, pt was constipated. This pattern is normal because pt states she does not have middle ground with getting normal stools. This past week, she noticed her L sitting bones getting more tender than her R and she is not sure if it is because she had adjusted her bike pedal strap to foot pain. Pt feels no more cramping  with urination.               [facility] PT Assessment - 09/13/17 1016      Single Leg Stance   Comments L SLS with L L perturbation      ROM / Strength   AROM / PROM / Strength --  hip ext R ~10 deg, L deg 0 deg sidelying,       AROM   Overall AROM Comments standing hip ext: front lean w/ hip ext ~ 10 deg,  R LE no lean with ~20 deg       Strength   Overall Strength Comments L hip abd 4-/5, R 5/5                      [facility] Adult PT Treatment/Exercise - 09/13/17 1607      Exercises   Exercises --  see pt instructions      Manual Therapy   Manual therapy comments long axis distraction to promote hip ext on L                 PT Education - 09/13/17 1054    Education provided Yes   Education Details HEP   Person(s) Educated Patient   Methods Explanation;Demonstration;Tactile cues;Verbal cues;Handout   Comprehension Returned demonstration;Verbalized understanding  PT Long Term Goals - 09/13/17 1008      PT LONG TERM GOAL #1   Title Pt will decrease her  score on ODI    from 28% to < 25% in order to return to ADLs   Time 12   Period Weeks   Status On-going     PT LONG TERM GOAL #2   Title Pt will demo IND with scoliosis HEP in order to decrease pain    Time 6   Period Months   Status On-going     PT LONG TERM GOAL #3   Title Pt will be compliant with wearing heel lift in order to minimize pelvic obliquities and to minimize L abdominal pain   Time 12   Period Weeks   Status Deferred     PT LONG TERM GOAL #4   Title Pt will report no sciatic pain after riding bike across 1 week in order to maintain health and wellness   Time 12   Period Weeks   Status Achieved     PT LONG TERM GOAL #5   Title Pt will demo no lumbopelvic perturbation with deep core level 2-3 for 5 reps and proper pelvic floor ROM without tensions in order to optimize intraabdominal pressure system for postural stability and progression of fitness exercises    Time 12   Period Weeks   Status On-going     PT LONG TERM GOAL #6   Title Pt will decrease her COREFO score from 31% to > 26% in order to restore bowel function   Time 12   Period Weeks   Status On-going     PT LONG TERM GOAL #7   Title Pt will report decreased L abdominal pelvic pain by 50% in order to improve QOL   Time 12   Period Weeks   Status On-going     PT LONG TERM GOAL #8   Title Pt will demo no mm tensions/ tenderness posterior to pubic symphysis and tubercle B in order to promote urinary function and decrease pain   Time 12   Period Weeks   Status New               Plan - 09/13/17 1055    Clinical Impression Statement Pt reports no more difficulty nor pain with urination and therefore, this session's focuse shifted from pelvic floor to assessing lower kinetic chain and hip closely. Pt has a return of the L pubic pain across the past 2 weeks but the 2 weeks prior, pt had minimal pain.  Today, pt showed weakness in glut max and medius mm on L along with hip extension ROM in closed and open chain.  Plan to continue addressing pelvic and lower kinetic chain imbalances to help pt progress towards  her remaining goals. Suspect pt's limited hip mobility is contributing to L pubic pain.  Added HEP for well-rounded flexibility of hip / legs to minimize overactivity of hip flexors and quad mm from cycling which is her preferred mode of exercise. Pt continues to benefit from skilled PT.   Rehab Potential Good   PT Frequency 1x / week   PT Duration 12 weeks   PT Treatment/Interventions ADLs/Self Care Home Management;Neuromuscular re-education;Manual techniques;Manual lymph drainage;Patient/family education;Therapeutic activities;Therapeutic exercise;Moist Heat;Scar mobilization;Energy conservation   Consulted and Agree with Plan of Care Patient      Patient will benefit from skilled therapeutic intervention in order to improve the following deficits and impairments:  Pain,  Postural dysfunction,  Increased muscle spasms, Impaired tone, Decreased mobility, Increased fascial restricitons, Improper body mechanics, Difficulty walking, Decreased safety awareness, Decreased balance, Decreased activity tolerance, Decreased range of motion, Decreased endurance, Decreased strength, Hypomobility, Decreased coordination, Decreased cognition  Visit Diagnosis: Other idiopathic scoliosis, cervicothoracic region  Sacrococcygeal disorders, not elsewhere classified  Myalgia  Chronic bilateral low back pain with bilateral sciatica     Problem List There are no active problems to display for this patient.   Mariane MastersYeung,Shin Yiing ,PT, DPT, E-RYT  09/13/2017, 4:12 PM  [place] [facility] [address] [place], [place], [address] Phone: [phone]   Fax:  [phone]  Name: Olivia Walter MRN: [id number] Date of Birth: 11/07/1986

## 2017-09-13 NOTE — Patient Instructions (Signed)
L sided strengthening:  1) Low lunge with L hand on chair , back toes tuckes, feet hip width apart Front R knee above ankle and does not move 10 reps  X 1 set    2) Lay on R side to lift L knee  Clam Shell 45 Degrees   Lying with hips and knees bent 45, one pillow between knees and ankles. Lift knee with exhale. Be sure pelvis does not roll backward. Do not arch back. Do 10 times x 2  , each leg, 2 times per day.  http://ss.exer.us/75   Copyright  VHI. All rights reserved.   3)  Bridging Position:  Elbows and arms pressed to the floor, knees hip width apart, L heel under knee, R mid arch more forward than L toes  Inhale , do nothing, exhale, lift buttocks up without wobblyness of pelvis while pressing arms and shoulders, feet into floor/ bed   10 x 2 sets     _________  LEG STRETCHING ROUTINE;  1) Quad ( sidelying with strap) without bending back  10 reps , knee back and forth   2) IT BAND Side of hip stretch: Reclined twist for hips and side of the hips/ legs  Lay on your back, knees bend Scoot hips to the L , leave shoulders in place Strap on ballmounds of L foot, hold strap in R hand Straighten R leg and drop L leg over to the L, resting onto pillows to keep leg at the same width of hips 5 breaths   Switch sides    3) On back strap, L knee bent, R ballmound against strap and spread toes, rolling foot 15 deg out and in across midline.  10 reps each side    4) hamstring  Stretches for your legs:  Use upper arms and elbows for stability when pulling strap Strap on ballmound: _knee bends 10 reps    5) pelvic floor stretches : figure-4  And cross over   6) calf stretch

## 2017-09-25 ENCOUNTER — Ambulatory Visit: Payer: BLUE CROSS/BLUE SHIELD | Admitting: Physical Therapy

## 2017-09-25 DIAGNOSIS — G8929 Other chronic pain: Secondary | ICD-10-CM

## 2017-09-25 DIAGNOSIS — M533 Sacrococcygeal disorders, not elsewhere classified: Secondary | ICD-10-CM

## 2017-09-25 DIAGNOSIS — M5442 Lumbago with sciatica, left side: Secondary | ICD-10-CM

## 2017-09-25 DIAGNOSIS — M5441 Lumbago with sciatica, right side: Secondary | ICD-10-CM

## 2017-09-25 DIAGNOSIS — M791 Myalgia, unspecified site: Secondary | ICD-10-CM

## 2017-09-25 DIAGNOSIS — M4123 Other idiopathic scoliosis, cervicothoracic region: Secondary | ICD-10-CM | POA: Diagnosis not present

## 2017-09-25 NOTE — Therapy (Addendum)
[place] MAIN [facility] SERVICES [address] [place], [place], [address] Phone: [phone]   Fax:  [phone]  Physical Therapy Treatment / Discharge Summary  Patient Details  Name: Olivia Walter MRN: [id number] Date of Birth: 08-Jul-1986 Referring Provider: Leafy Ro  Encounter Date: 09/25/2017      PT End of Session - 09/25/17 2245    Visit Number 7   Number of Visits 12   Date for PT Re-Evaluation 10/04/17   PT Start Time 1700   PT Stop Time 1130   PT Time Calculation (min) 25 min   Activity Tolerance Patient tolerated treatment well;No increased pain   Behavior During Therapy WFL for tasks assessed/performed      Past Medical History:  Diagnosis Date  . Asthma   . Kidney calculus   . Kidney stones   . Osteoarthritis   . Reactive hypoglycemia     Past Surgical History:  Procedure Laterality Date  . CAPSULE ENDOSCOPY    . COLONOSCOPY    . cyst removed on wrist    . ESOPHAGOGASTRODUODENOSCOPY    . ESOPHAGOGASTRODUODENOSCOPY (EGD) WITH PROPOFOL N/A 10/01/2016   Procedure: ESOPHAGOGASTRODUODENOSCOPY (EGD) WITH PROPOFOL;  Surgeon: Lollie Sails, MD;  Location: [facility] ENDOSCOPY;  Service: Endoscopy;  Laterality: N/A;  . MOUTH SURGERY      There were no vitals filed for this visit.      Subjective Assessment - 09/25/17 1106    Subjective Pt reported her pubic pain has returned to a level of 7-9/10 the past 3 weeks. Urination is no longer difficult. Pt recently stepped into a hole yesterday and her ankle rolled in and she fell to tground with her knee and ankle twisted in. Pt wears a boot now to walk into the clinic. Pt has not seen a doctor about her ankle. Pt would like to set up a follow up appointment with her gynecologist.              [facility] PT Assessment - 09/25/17 2242      Observation/Other Assessments   Observations less upper trap tightness, more relaxed posture  pt wearing a boot on L foot, sprained ankle  yesterday                     [place] Adult PT Treatment/Exercise - 09/25/17 2241      Therapeutic Activites    Therapeutic Activities --  see pt instructions/ reassessed goals/ d/c/ referred to Urge                     PT Long Term Goals - 09/25/17 1109      PT LONG TERM GOAL #1   Title Pt will decrease her  score on ODI    from 28% to < 25% in order to return to ADLs  ( 10/31: 4%)    Time 12   Period Weeks   Status Achieved     PT LONG TERM GOAL #2   Title Pt will demo IND with scoliosis HEP in order to decrease pain    Time 6   Period Months   Status Achieved     PT LONG TERM GOAL #3   Title Pt will be compliant with wearing heel lift in order to minimize pelvic obliquities and to minimize L abdominal pain    Time 12   Period Weeks   Status Deferred     PT LONG TERM GOAL #4   Title Pt will  report no sciatic pain after riding bike across 1 week in order to maintain health and wellness   Time 12   Period Weeks   Status Achieved     PT LONG TERM GOAL #5   Title Pt will demo no lumbopelvic perturbation with deep core level 2-3 for 5 reps and proper pelvic floor ROM without tensions in order to optimize intraabdominal pressure system for postural stability and progression of fitness exercises   Time 12   Period Weeks   Status Achieved     PT LONG TERM GOAL #6   Title Pt will decrease her COREFO score from 31% to > 26% in order to restore bowel function  ( 10/31: 18%)    Time 12   Period Weeks   Status Achieved     PT LONG TERM GOAL #7   Title Pt will report decreased L abdominal pelvic pain by 50% in order to improve QOL   Time 12   Period Weeks   Status Not Met     PT LONG TERM GOAL #8   Title Pt will demo no mm tensions/ tenderness posterior to pubic symphysis and tubercle B in order to promote urinary function and decrease pain   Time 12   Period Weeks   Status (P)  Achieved               Plan - 09/25/17 1110    Clinical  Impression Statement  Pt's L abdominal pain had improved for 2 weeks with a decreased pain level of 1-2/10 but for the past 3 weeks, the pubic pain has increased to 7-9/10 which is the same as prior to PT. Pt is being referrred back to referring provider, her gynecologist as it appears to not be musculoskeletal in origin. Pt did not meet the goal related to decreasing her abdominal pain.   Pt has met her other 6/7 goals. Pt has improved scores on the  Oswetry Disability Index and  Colorectal Function Questionnaire which indicate improved back pain / sciatic and bowel function. Pt also demo'd improved pelvic girdle alignment, decreased pelvic floor tightness/ tenderness, and improved pelvic floor coordination. Pt no longer has difficulty with urination. Pt is IND with her HEP customized to better manage her scoliosis which was a major contributing factor to her deficits.   Pt also has sustained an acute L ankle injury and was wearing an ankle boot today. Pt had no swelling. Pt was recommended to f/u with Urgent Care/ Orthopedist. Pt voiced she will do so later today. Pt was educated on wearing a shoe lift attached to the outside of RLE to accommodate for the increased height of ankle boot. This recommendation was intended to also help minimize her risk for pelvic misalignment which had improed signficantly with PT.  Pt is being d/c at this  time.   Thank you for your referral!    Rehab Potential Good   PT Frequency 1x / week   PT Duration 12 weeks   PT Treatment/Interventions ADLs/Self Care Home Management;Neuromuscular re-education;Manual techniques;Manual lymph drainage;Patient/family education;Therapeutic activities;Therapeutic exercise;Moist Heat;Scar mobilization;Energy conservation   Consulted and Agree with Plan of Care Patient      Patient will benefit from skilled therapeutic intervention in order to improve the following deficits and impairments:  Pain, Postural dysfunction, Increased muscle  spasms, Impaired tone, Decreased mobility, Increased fascial restricitons, Improper body mechanics, Difficulty walking, Decreased safety awareness, Decreased balance, Decreased activity tolerance, Decreased range of motion, Decreased endurance, Decreased strength, Hypomobility, Decreased  coordination, Decreased cognition  Visit Diagnosis: Other idiopathic scoliosis, cervicothoracic region  Sacrococcygeal disorders, not elsewhere classified  Myalgia  Chronic bilateral low back pain with bilateral sciatica     Problem List There are no active problems to display for this patient.   Jerl Mina ,PT, DPT, E-RYT  09/25/2017, 10:54 PM  [place] MAIN [facility] SERVICES [address] [place], [place], [address] Phone: [phone]   Fax:  [phone]  Name: Marilou Barnfield MRN: [id number] Date of Birth: 25-Jan-1986

## 2017-09-25 NOTE — Patient Instructions (Signed)
recommended wearing a shoe lift over tennis if wearing ankle boot for a duration of time to minimize pelvic asymmetry   Follow up with gynecologist about abdominal pain and Urgent Care / Orthopedist about acute ankle injury   Bridging series w/ resistive band other side of doorknob:  Level 1:  Position:  Elbows bent, knees hip width apart, heels under knees   Stabilization points: shoulders, upper arms, back of head pressed into floor. Heel press downward.   Movement: inhale do nothing, exhale pull band by side, lower fists to floor completely while lifting hips.Keep stabilization points engaged when you allow the band to go back to starting position  10 x 2 reps       Level 2:  Position:  Elbows straight, arms raised to ceiling at shoulder height, knees apart like a ballerina,heels together, heels under knees, shinbone perpendicular to ground   Stabilization points: shoulders, upper arms, back of head pressed into floor. Heel press downward.   Movement: inhale do nothing, exhale pull band by side, lower fists to floor completely while lifting hips. Keep stabilization points engaged when you allow the band to go back to starting position   10 x 2 reps  Shoulder training: Try to imagine you are squeezing a pencil under your armpit and your shoulder blades are down away from your ears and towards each other

## 2017-10-09 ENCOUNTER — Ambulatory Visit: Payer: BLUE CROSS/BLUE SHIELD | Admitting: Physical Therapy

## 2017-10-25 ENCOUNTER — Encounter: Payer: BLUE CROSS/BLUE SHIELD | Admitting: Physical Therapy

## 2017-11-07 NOTE — H&P (Signed)
HPI: Pelvic pain, chronic:  - depo x10 yrs, used to work, now pain not controlled. RRQ>RLQ - clitoral and bladder related. TVUS wnl 02/2017 and today 10/2017. S[facility]. S/p urology. s/p pelvic floor physical therapy. Pt is requesting hysterectomy (TL[facility]) - Hx of abnormal pap smears  Workup:  Pap: normal in 5/18 EMBx: negative 11/06/17 TVUS:  wnl x2 in 2018  Anteverted, 5x2x4cm   Past Medical History:  has a past medical history of Kidney stones, Osteoarthritis, Reactive hypoglycemia, and Reflux esophagitis (10/01/2016).  Past Surgical History:  has a past surgical history that includes Colonoscopy; Upper gastrointestinal endoscopy; Capsule Endoscopy Gastrointestinal Tract (09/05/2009); Oral Surgery Operative Note; gangelion cyst; and egd (10/01/2016). Family History: family history includes Brain cancer in her maternal uncle; Celiac disease in her sister; Coronary Artery Disease (Blocked arteries around heart) in her paternal grandfather; Crohn's disease in her sister; Diabetes type II in her mother; Heart disease in her mother; High blood pressure (Hypertension) in her father and mother; Hyperlipidemia (Elevated cholesterol) in her father and mother; Liver disease in her mother; Multiple sclerosis in her maternal uncle; Schizophrenia in her brother; Stroke in her maternal grandmother. Social History:  reports that she has quit smoking. Her smoking use included cigarettes. She smoked 0.50 packs per day. She has never used smokeless tobacco. She reports that she does not drink alcohol or use drugs. OB/GYN History:          OB History    Gravida  0   Para  0   Term  0   Preterm  0   AB  0   Living  0     SAB  0   TAB  0   Ectopic  0   Molar  0   Multiple  0   Live Births  0          Allergies: is allergic to clindamycin hcl; penicillin; sulfamethoxazole-trimethoprim; and venom-honey bee. Medications:  Current Outpatient  Medications:  .  aspirin-acetaminophen-caffeine (EXCEDRIN MIGRAINE) 250-250-65 mg per tablet, Take by mouth as needed.  , Disp: , Rfl:  .  ibuprofen (ADVIL,MOTRIN) 800 MG tablet, Take by mouth., Disp: , Rfl:  .  medroxyPROGESTERone (DEPO-PROVERA) 150 mg/mL IM syringe, Inject 1 mL (150 mg total) into the muscle every 3 (three) months., Disp: 1 Syringe, Rfl: 6 .  pantoprazole (PROTONIX) 40 MG DR tablet, TAKE 1 TABLET (40 MG TOTAL) BY MOUTH 2 (TWO) TIMES DAILY. TAKE BEFORE MEALS., Disp: 180 tablet, Rfl: 0   Review of Systems: No SOB, no palpitations or chest pain, no new lower extremity edema, no nausea or vomiting or bowel or bladder complaints. See HPI for gyn specific ROS.   Exam:   BP 116/83   Pulse 85   Wt 79.7 kg (175 lb 12.8 oz)   BMI 32.15 kg/m   General: Patient is well-groomed, well-nourished, appears stated age in no acute distress  HEENT: head is atraumatic and normocephalic, trachea is midline, neck is supple with no palpable nodules  CV: Regular rhythm and normal heart rate, no murmur  Pulm: Clear to auscultation throughout lung fields with no wheezing, crackles, or rhonchi. No increased work of breathing  Abdomen: soft , no mass, non-tender, no rebound tenderness, no hepatomegaly  Pelvic: tanner stage 5 ,              External genitalia: vulva /labia no lesions             Urethra: no  prolapse             Vagina: normal physiologic d/c, laxity in vaginal walls             Cervix: no lesions, no cervical motion tenderness, good descent             Uterus: normal size shape and contour, non-tender             Adnexa: no mass,  non-tender               Rectovaginal: External wnl  Endometrial biopsy: The cervix was cleaned with betadine, topical Hurriciane spray applied, and a single tooth tenaculum is applied to the anterior cervix. The Pipelle catheter was placed into the endometrial cavity. It sounds to 7 cm and adequate tissue was removed.   Significant  pain with bx   Impression:   The encounter diagnosis was Pelvic pain in female.    Plan:    Patient returns for consideration treatements for her AUB. She would like surgical treatment of her chronic pelvic pain by total laparoscopic hysterectomy with bilateral salpingectomy and possible left oophorectomy because many times her pain is localized to this area.   We will perform a cystoscopy to evaluate the urinary tract after the procedure with hydro distension for pelvic pain.  The patient and I discussed the technical aspects of the procedure including the potential for risks and complications. These include but are not limited to the risk of infection requiring post-operative antibiotics or further procedures. We talked about the risk of injury to adjacent organs including bladder, bowel, ureter, blood vessels or nerves. We talked about the need to convert to an open incision. We talked about the possible need for blood transfusion. We talked aboutpostop complications such asthromboembolic or cardiopulmonary complications. All of her questions were answered.  Her preoperative exam was completed and the appropriate consents were signed. She is scheduled to undergo this procedure in the near future.  Specific Peri-operative Considerations:  - Consent: obtained today - Health Maintenance: up to date - Labs: CBC, CMP preoperatively - Studies: EKG, CXR preoperatively - Bowel Preparation: None required - Abx:  Cefoxitin 2g - VTE ppx: SCDs perioperatively - Glucose Protocol: n/a - Beta-blockade: n/a

## 2017-11-15 ENCOUNTER — Ambulatory Visit: Payer: BLUE CROSS/BLUE SHIELD | Admitting: Physical Therapy

## 2017-11-27 ENCOUNTER — Encounter
Admission: RE | Admit: 2017-11-27 | Discharge: 2017-11-27 | Disposition: A | Discharge status: Home / Self Care | Payer: BLUE CROSS/BLUE SHIELD | Source: Ambulatory Visit | Attending: Obstetrics and Gynecology | Admitting: Obstetrics and Gynecology

## 2017-11-27 ENCOUNTER — Other Ambulatory Visit: Payer: Self-pay

## 2017-11-27 DIAGNOSIS — R102 Pelvic and perineal pain: Secondary | ICD-10-CM | POA: Insufficient documentation

## 2017-11-27 DIAGNOSIS — Z809 Family history of malignant neoplasm, unspecified: Secondary | ICD-10-CM | POA: Insufficient documentation

## 2017-11-27 DIAGNOSIS — Z882 Allergy status to sulfonamides status: Secondary | ICD-10-CM | POA: Insufficient documentation

## 2017-11-27 DIAGNOSIS — M1990 Unspecified osteoarthritis, unspecified site: Secondary | ICD-10-CM | POA: Insufficient documentation

## 2017-11-27 DIAGNOSIS — Z823 Family history of stroke: Secondary | ICD-10-CM | POA: Diagnosis not present

## 2017-11-27 DIAGNOSIS — E161 Other hypoglycemia: Secondary | ICD-10-CM | POA: Diagnosis not present

## 2017-11-27 DIAGNOSIS — Z881 Allergy status to other antibiotic agents status: Secondary | ICD-10-CM | POA: Diagnosis not present

## 2017-11-27 DIAGNOSIS — Z833 Family history of diabetes mellitus: Secondary | ICD-10-CM | POA: Diagnosis not present

## 2017-11-27 DIAGNOSIS — Z01812 Encounter for preprocedural laboratory examination: Secondary | ICD-10-CM | POA: Insufficient documentation

## 2017-11-27 DIAGNOSIS — Z91030 Bee allergy status: Secondary | ICD-10-CM | POA: Insufficient documentation

## 2017-11-27 DIAGNOSIS — K219 Gastro-esophageal reflux disease without esophagitis: Secondary | ICD-10-CM | POA: Insufficient documentation

## 2017-11-27 DIAGNOSIS — Z880 Allergy status to penicillin: Secondary | ICD-10-CM | POA: Diagnosis not present

## 2017-11-27 DIAGNOSIS — Z791 Long term (current) use of non-steroidal anti-inflammatories (NSAID): Secondary | ICD-10-CM | POA: Diagnosis not present

## 2017-11-27 DIAGNOSIS — Z9889 Other specified postprocedural states: Secondary | ICD-10-CM | POA: Diagnosis not present

## 2017-11-27 DIAGNOSIS — G8929 Other chronic pain: Secondary | ICD-10-CM | POA: Diagnosis not present

## 2017-11-27 DIAGNOSIS — Z79899 Other long term (current) drug therapy: Secondary | ICD-10-CM | POA: Diagnosis not present

## 2017-11-27 DIAGNOSIS — Z818 Family history of other mental and behavioral disorders: Secondary | ICD-10-CM | POA: Insufficient documentation

## 2017-11-27 DIAGNOSIS — Z87442 Personal history of urinary calculi: Secondary | ICD-10-CM | POA: Diagnosis not present

## 2017-11-27 DIAGNOSIS — Z87891 Personal history of nicotine dependence: Secondary | ICD-10-CM | POA: Insufficient documentation

## 2017-11-27 DIAGNOSIS — Z8249 Family history of ischemic heart disease and other diseases of the circulatory system: Secondary | ICD-10-CM | POA: Diagnosis not present

## 2017-11-27 HISTORY — DX: Personal history of urinary calculi: Z87.442

## 2017-11-27 HISTORY — DX: Gastro-esophageal reflux disease without esophagitis: K21.9

## 2017-11-27 HISTORY — DX: Headache, unspecified: R51.9

## 2017-11-27 HISTORY — DX: Personal history of other diseases of the digestive system: Z87.19

## 2017-11-27 HISTORY — DX: Bipolar disorder, unspecified: F31.9

## 2017-11-27 HISTORY — DX: Personal history of peptic ulcer disease: Z87.11

## 2017-11-27 HISTORY — DX: Headache: R51

## 2017-11-27 LAB — CBC
HEMATOCRIT: 42.9 % (ref 35.0–47.0)
Hemoglobin: 14.8 g/dL (ref 12.0–16.0)
MCH: 31 pg (ref 26.0–34.0)
MCHC: 34.6 g/dL (ref 32.0–36.0)
MCV: 89.7 fL (ref 80.0–100.0)
PLATELETS: 359 10*3/uL (ref 150–440)
RBC: 4.78 MIL/uL (ref 3.80–5.20)
RDW: 14.4 % (ref 11.5–14.5)
WBC: 8.6 10*3/uL (ref 3.6–11.0)

## 2017-11-27 LAB — BASIC METABOLIC PANEL
Anion gap: 10 (ref 5–15)
BUN: 8 mg/dL (ref 6–20)
CHLORIDE: 104 mmol/L (ref 101–111)
CO2: 25 mmol/L (ref 22–32)
Calcium: 9.7 mg/dL (ref 8.9–10.3)
Creatinine, Ser: 0.87 mg/dL (ref 0.44–1.00)
GFR calc Af Amer: >60 mL/min (ref >60)
GFR calc non Af Amer: >60 mL/min (ref >60)
GLUCOSE: 94 mg/dL (ref 65–99)
POTASSIUM: 3.4 mmol/L — AB (ref 3.5–5.1)
SODIUM: 139 mmol/L (ref 135–145)

## 2017-11-27 LAB — TYPE AND SCREEN
ABO/RH(D): A POS
Antibody Screen: NEGATIVE

## 2017-11-27 NOTE — Patient Instructions (Signed)
Your procedure is scheduled on: December 02, 2017 (Monday )Report to Same Day Surgery 2nd floor medical mall [facility](Medical Mall Entrance-take elevator on left to 2nd floor.  Check in with surgery information desk.) To find out your arrival time please call [phone] between 1PM - 3PM on November 29, 2017 (Friday )   Remember: Instructions that are not followed completely may result in serious medical risk, up to and including death, or upon the discretion of your surgeon and anesthesiologist your surgery may need to be rescheduled.    _x___ 1. Do not eat food after midnight the night before your procedure. You may drink clear liquids up to 2 hours before you are scheduled to arrive at the hospital for your procedure.  Do not drink clear liquids within 2 hours of your scheduled arrival to the hospital.  Clear liquids include  --Water or Apple juice without pulp  --Clear carbohydrate beverage such as ClearFast or Gatorade  --Black Coffee or Clear Tea (No milk, no creamers, do not add anything to the coffee or tea                   Type 1 and type 2 diabetics should only drink water.  No gum chewing or hard candies.     __x__ 2. No Alcohol for 24 hours before or after surgery.   __x__3. No Smoking for 24 prior to surgery.   ____  4. Bring all medications with you on the day of surgery if instructed.    __x__ 5. Notify your doctor if there is any change in your medical condition     (cold, fever, infections).     Do not wear jewelry, make-up, hairpins, clips or nail polish.  Do not wear lotions, powders, or perfumes.   Do not shave 48 hours prior to surgery. Men may shave face and neck.  Do not bring valuables to the hospital.    [facility] is not responsible for any belongings or valuables.               Contacts, dentures or bridgework may not be worn into surgery.  Leave your suitcase in the car. After surgery it may be brought to your room.  For patients admitted to the hospital,  discharge time is determined by your treatment team                          Patients discharged the day of surgery will not be allowed to drive home.  You will need someone to drive you home and stay with you the night of your procedure.    Please read over the following fact sheets that you were given:   [facility] Preparing for Surgery and or MRSA Information   _x___ Take anti-hypertensive listed below, cardiac, seizure, asthma,     anti-reflux and psychiatric medicines with a sip of water. These include:  1.   2.  3.  4.  5.  6.  ____Fleets enema or Magnesium Citrate as directed.   _x___ Use CHG Soap or sage wipes as directed on instruction sheet   ____ Use inhalers on the day of surgery and bring to hospital day of surgery  ____ Stop Metformin and Janumet 2 days prior to surgery.    ____ Take 1/2 of usual insulin dose the night before surgery and none on the morning surgery.       _x___ Follow recommendations from Cardiologist, Pulmonologist or  PCP regarding          stopping Aspirin, Coumadin, Plavix ,Eliquis, Effient, or Pradaxa, and Pletal.  X____Stop Anti-inflammatories such as Advil, Aleve, Ibuprofen, Motrin, Naproxen, Naprosyn, Goodies powders or aspirin products. OK to take Tylenol (STOP EXCEDRIN MIGRAINE AND IBUPROFEN NOW )                           _x___ Stop supplements until after surgery.  But may continue Vitamin D, Vitamin B, and multivitamin         ____ Bring C-Pap to the hospital.    BRING INCENTIVE SPIROMETRY TO HOSPITAL THE DAY OF SURGERY

## 2017-12-01 MED ORDER — METRONIDAZOLE IN NACL 5-0.79 MG/ML-% IV SOLN
500.0000 mg | INTRAVENOUS | Status: AC
  Administered 2017-12-02: 500 mg via INTRAVENOUS
  Filled 2017-12-01: qty 100

## 2017-12-01 MED ORDER — GENTAMICIN SULFATE 40 MG/ML IJ SOLN
5.0000 mg/kg | INTRAVENOUS | Status: AC
  Administered 2017-12-02: 320.4 mg via INTRAVENOUS
  Filled 2017-12-01: qty 8

## 2017-12-02 ENCOUNTER — Ambulatory Visit: Payer: BLUE CROSS/BLUE SHIELD | Admitting: Anesthesiology

## 2017-12-02 ENCOUNTER — Encounter: Payer: Self-pay | Admitting: Emergency Medicine

## 2017-12-02 ENCOUNTER — Ambulatory Visit
Admission: RE | Admit: 2017-12-02 | Discharge: 2017-12-02 | Disposition: A | Discharge status: Home / Self Care | Payer: BLUE CROSS/BLUE SHIELD | Source: Ambulatory Visit | Attending: Obstetrics and Gynecology | Admitting: Obstetrics and Gynecology

## 2017-12-02 ENCOUNTER — Encounter
Admission: RE | Disposition: A | Discharge status: Home / Self Care | Payer: Self-pay | Source: Ambulatory Visit | Attending: Obstetrics and Gynecology

## 2017-12-02 DIAGNOSIS — N289 Disorder of kidney and ureter, unspecified: Secondary | ICD-10-CM | POA: Insufficient documentation

## 2017-12-02 DIAGNOSIS — M1990 Unspecified osteoarthritis, unspecified site: Secondary | ICD-10-CM | POA: Diagnosis not present

## 2017-12-02 DIAGNOSIS — N838 Other noninflammatory disorders of ovary, fallopian tube and broad ligament: Secondary | ICD-10-CM | POA: Diagnosis not present

## 2017-12-02 DIAGNOSIS — Z7982 Long term (current) use of aspirin: Secondary | ICD-10-CM | POA: Diagnosis not present

## 2017-12-02 DIAGNOSIS — K219 Gastro-esophageal reflux disease without esophagitis: Secondary | ICD-10-CM | POA: Diagnosis not present

## 2017-12-02 DIAGNOSIS — N800 Endometriosis of uterus: Secondary | ICD-10-CM | POA: Insufficient documentation

## 2017-12-02 DIAGNOSIS — R102 Pelvic and perineal pain: Secondary | ICD-10-CM | POA: Diagnosis present

## 2017-12-02 DIAGNOSIS — N72 Inflammatory disease of cervix uteri: Secondary | ICD-10-CM | POA: Diagnosis not present

## 2017-12-02 DIAGNOSIS — Z793 Long term (current) use of hormonal contraceptives: Secondary | ICD-10-CM | POA: Diagnosis not present

## 2017-12-02 DIAGNOSIS — Z87891 Personal history of nicotine dependence: Secondary | ICD-10-CM | POA: Diagnosis not present

## 2017-12-02 DIAGNOSIS — Z79899 Other long term (current) drug therapy: Secondary | ICD-10-CM | POA: Insufficient documentation

## 2017-12-02 HISTORY — PX: LAPAROSCOPIC HYSTERECTOMY: SHX1926

## 2017-12-02 HISTORY — PX: LAPAROSCOPIC BILATERAL SALPINGECTOMY: SHX5889

## 2017-12-02 HISTORY — PX: CYSTOSCOPY: SHX5120

## 2017-12-02 LAB — POCT PREGNANCY, URINE: Preg Test, Ur: NEGATIVE

## 2017-12-02 LAB — ABO/RH: ABO/RH(D): A POS

## 2017-12-02 SURGERY — HYSTERECTOMY, TOTAL, LAPAROSCOPIC
Anesthesia: General | Wound class: Clean Contaminated

## 2017-12-02 MED ORDER — FENTANYL CITRATE (PF) 250 MCG/5ML IJ SOLN
INTRAMUSCULAR | Status: AC
  Filled 2017-12-02: qty 5

## 2017-12-02 MED ORDER — FAMOTIDINE 20 MG PO TABS
ORAL_TABLET | ORAL | Status: AC
  Administered 2017-12-02: 20 mg via ORAL
  Filled 2017-12-02: qty 1

## 2017-12-02 MED ORDER — DOCUSATE SODIUM 100 MG PO CAPS: 100.0000 mg | ORAL_CAPSULE | Freq: Two times a day (BID) | ORAL | 0 refills | Status: DC

## 2017-12-02 MED ORDER — FENTANYL CITRATE (PF) 100 MCG/2ML IJ SOLN
25.0000 ug | INTRAMUSCULAR | Status: AC | PRN
Start: 2017-12-02 — End: 2017-12-02
  Administered 2017-12-02 (×6): 25 ug via INTRAVENOUS

## 2017-12-02 MED ORDER — ROCURONIUM BROMIDE 100 MG/10ML IV SOLN
INTRAVENOUS | Status: DC | PRN
  Administered 2017-12-02: 50 mg via INTRAVENOUS

## 2017-12-02 MED ORDER — SUGAMMADEX SODIUM 200 MG/2ML IV SOLN
INTRAVENOUS | Status: AC
Start: 2017-12-02 — End: 2017-12-02
  Filled 2017-12-02: qty 2

## 2017-12-02 MED ORDER — GABAPENTIN 300 MG PO CAPS
900.0000 mg | ORAL_CAPSULE | ORAL | Status: AC
  Administered 2017-12-02: 900 mg via ORAL

## 2017-12-02 MED ORDER — GABAPENTIN 800 MG PO TABS: 800.0000 mg | ORAL_TABLET | Freq: Every day | ORAL | 0 refills | Status: DC

## 2017-12-02 MED ORDER — OXYCODONE HCL 5 MG PO TABS
5.0000 mg | ORAL_TABLET | Freq: Once | ORAL | Status: AC
  Administered 2017-12-02: 5 mg via ORAL

## 2017-12-02 MED ORDER — PROMETHAZINE HCL 25 MG/ML IJ SOLN
INTRAMUSCULAR | Status: AC
  Filled 2017-12-02: qty 1

## 2017-12-02 MED ORDER — FAMOTIDINE 20 MG PO TABS
20.0000 mg | ORAL_TABLET | Freq: Once | ORAL | Status: AC
  Administered 2017-12-02: 20 mg via ORAL

## 2017-12-02 MED ORDER — PROPOFOL 10 MG/ML IV BOLUS
INTRAVENOUS | Status: AC
  Filled 2017-12-02: qty 20

## 2017-12-02 MED ORDER — KETOROLAC TROMETHAMINE 30 MG/ML IJ SOLN
INTRAMUSCULAR | Status: DC | PRN
  Administered 2017-12-02: 30 mg via INTRAVENOUS

## 2017-12-02 MED ORDER — ONDANSETRON HCL 4 MG/2ML IJ SOLN
INTRAMUSCULAR | Status: DC | PRN
  Administered 2017-12-02: 4 mg via INTRAVENOUS

## 2017-12-02 MED ORDER — LIDOCAINE HCL (PF) 2 % IJ SOLN
INTRAMUSCULAR | Status: AC
  Filled 2017-12-02: qty 10

## 2017-12-02 MED ORDER — ACETAMINOPHEN 500 MG PO TABS
1000.0000 mg | ORAL_TABLET | ORAL | Status: AC
  Administered 2017-12-02: 1000 mg via ORAL

## 2017-12-02 MED ORDER — DEXAMETHASONE SODIUM PHOSPHATE 10 MG/ML IJ SOLN
INTRAMUSCULAR | Status: AC
  Filled 2017-12-02: qty 1

## 2017-12-02 MED ORDER — OXYCODONE HCL 5 MG PO TABS
ORAL_TABLET | ORAL | Status: AC
  Filled 2017-12-02: qty 1

## 2017-12-02 MED ORDER — OXYCODONE HCL 5 MG PO TABS
5.0000 mg | ORAL_TABLET | Freq: Four times a day (QID) | ORAL | Status: DC | PRN
Start: 2017-12-02 — End: 2017-12-02
  Administered 2017-12-02: 5 mg via ORAL

## 2017-12-02 MED ORDER — FENTANYL CITRATE (PF) 100 MCG/2ML IJ SOLN
INTRAMUSCULAR | Status: AC
  Administered 2017-12-02: 25 ug via INTRAVENOUS
  Filled 2017-12-02: qty 2

## 2017-12-02 MED ORDER — IBUPROFEN 800 MG PO TABS: 800.0000 mg | ORAL_TABLET | Freq: Three times a day (TID) | ORAL | 1 refills | Status: DC | PRN

## 2017-12-02 MED ORDER — ROCURONIUM BROMIDE 50 MG/5ML IV SOLN
INTRAVENOUS | Status: AC
  Filled 2017-12-02: qty 1

## 2017-12-02 MED ORDER — KETOROLAC TROMETHAMINE 30 MG/ML IJ SOLN
INTRAMUSCULAR | Status: AC
  Filled 2017-12-02: qty 1

## 2017-12-02 MED ORDER — HYDROMORPHONE HCL 1 MG/ML IJ SOLN
0.5000 mg | INTRAMUSCULAR | Status: DC | PRN
  Administered 2017-12-02 (×2): 0.5 mg via INTRAVENOUS

## 2017-12-02 MED ORDER — ACETAMINOPHEN 500 MG PO TABS: 1000.0000 mg | ORAL_TABLET | Freq: Four times a day (QID) | ORAL | 0 refills | Status: AC

## 2017-12-02 MED ORDER — OXYCODONE HCL 5 MG PO CAPS: 5.0000 mg | ORAL_CAPSULE | Freq: Four times a day (QID) | ORAL | 0 refills | Status: DC | PRN

## 2017-12-02 MED ORDER — ONDANSETRON HCL 4 MG/2ML IJ SOLN
INTRAMUSCULAR | Status: AC
  Filled 2017-12-02: qty 2

## 2017-12-02 MED ORDER — FENTANYL CITRATE (PF) 100 MCG/2ML IJ SOLN
INTRAMUSCULAR | Status: DC | PRN
  Administered 2017-12-02 (×3): 50 ug via INTRAVENOUS
  Administered 2017-12-02: 100 ug via INTRAVENOUS

## 2017-12-02 MED ORDER — MIDAZOLAM HCL 2 MG/2ML IJ SOLN
INTRAMUSCULAR | Status: DC | PRN
  Administered 2017-12-02: 2 mg via INTRAVENOUS

## 2017-12-02 MED ORDER — GABAPENTIN 300 MG PO CAPS
ORAL_CAPSULE | ORAL | Status: AC
  Administered 2017-12-02: 900 mg via ORAL
  Filled 2017-12-02: qty 3

## 2017-12-02 MED ORDER — LACTATED RINGERS IV SOLN
INTRAVENOUS | Status: DC
  Administered 2017-12-02: 08:00:00 via INTRAVENOUS

## 2017-12-02 MED ORDER — ONDANSETRON HCL 4 MG/2ML IJ SOLN: 4.0000 mg | Freq: Once | INTRAMUSCULAR | Status: DC | PRN

## 2017-12-02 MED ORDER — PROPOFOL 10 MG/ML IV BOLUS
INTRAVENOUS | Status: DC | PRN
  Administered 2017-12-02: 160 mg via INTRAVENOUS

## 2017-12-02 MED ORDER — PROMETHAZINE HCL 25 MG PO TABS: 25.0000 mg | ORAL_TABLET | Freq: Four times a day (QID) | ORAL | 2 refills | Status: DC | PRN

## 2017-12-02 MED ORDER — SUGAMMADEX SODIUM 200 MG/2ML IV SOLN
INTRAVENOUS | Status: DC | PRN
  Administered 2017-12-02: 200 mg via INTRAVENOUS

## 2017-12-02 MED ORDER — MIDAZOLAM HCL 2 MG/2ML IJ SOLN
INTRAMUSCULAR | Status: AC
  Filled 2017-12-02: qty 2

## 2017-12-02 MED ORDER — PHENYLEPHRINE HCL 10 MG/ML IJ SOLN
INTRAMUSCULAR | Status: DC | PRN
  Administered 2017-12-02 (×3): 100 ug via INTRAVENOUS

## 2017-12-02 MED ORDER — HYDROMORPHONE HCL 1 MG/ML IJ SOLN
INTRAMUSCULAR | Status: AC
  Administered 2017-12-02: 0.5 mg via INTRAVENOUS
  Filled 2017-12-02: qty 1

## 2017-12-02 MED ORDER — ACETAMINOPHEN 500 MG PO TABS
ORAL_TABLET | ORAL | Status: AC
  Administered 2017-12-02: 1000 mg via ORAL
  Filled 2017-12-02: qty 2

## 2017-12-02 MED ORDER — SODIUM CHLORIDE 0.9 % IJ SOLN
INTRAMUSCULAR | Status: AC
  Filled 2017-12-02: qty 10

## 2017-12-02 MED ORDER — LIDOCAINE HCL (CARDIAC) 20 MG/ML IV SOLN
INTRAVENOUS | Status: DC | PRN
  Administered 2017-12-02: 80 mg via INTRAVENOUS

## 2017-12-02 MED ORDER — DEXAMETHASONE SODIUM PHOSPHATE 10 MG/ML IJ SOLN
INTRAMUSCULAR | Status: DC | PRN
  Administered 2017-12-02: 6 mg via INTRAVENOUS

## 2017-12-02 MED ORDER — PROMETHAZINE HCL 25 MG/ML IJ SOLN
6.2500 mg | Freq: Once | INTRAMUSCULAR | Status: AC
  Administered 2017-12-02: 6.25 mg via INTRAVENOUS

## 2017-12-02 SURGICAL SUPPLY — 61 items
BAG URINE DRAINAGE (UROLOGICAL SUPPLIES) ×8 IMPLANT
BLADE SURG SZ11 CARB STEEL (BLADE) ×4 IMPLANT
CATH FOLEY 2WAY  5CC 16FR (CATHETERS) ×2
CATH ROBINSON RED A/P 16FR (CATHETERS) ×4 IMPLANT
CATH URTH 16FR FL 2W BLN LF (CATHETERS) ×2 IMPLANT
CHLORAPREP W/TINT 26ML (MISCELLANEOUS) ×4 IMPLANT
CLOSURE WOUND 1/4X4 (GAUZE/BANDAGES/DRESSINGS) ×1
CORD MONOPOLAR M/FML 12FT (MISCELLANEOUS) ×4 IMPLANT
COUNTER NEEDLE 20/40 LG (NEEDLE) ×4 IMPLANT
COVER LIGHT HANDLE STERIS (MISCELLANEOUS) ×8 IMPLANT
DEFOGGER SCOPE WARMER CLEARIFY (MISCELLANEOUS) ×4 IMPLANT
DERMABOND ADVANCED (GAUZE/BANDAGES/DRESSINGS) ×2
DERMABOND ADVANCED .7 DNX12 (GAUZE/BANDAGES/DRESSINGS) ×2 IMPLANT
DEVICE SUTURE ENDOST 10MM (ENDOMECHANICALS) ×4 IMPLANT
DRAPE STERI POUCH LG 24X46 STR (DRAPES) ×4 IMPLANT
DRSG TEGADERM 2-3/8X2-3/4 SM (GAUZE/BANDAGES/DRESSINGS) ×12 IMPLANT
GAUZE SPONGE NON-WVN 2X2 STRL (MISCELLANEOUS) ×4 IMPLANT
GLOVE BIO SURGEON STRL SZ7 (GLOVE) ×20 IMPLANT
GLOVE INDICATOR 7.5 STRL GRN (GLOVE) ×12 IMPLANT
GOWN STRL REUS W/ TWL LRG LVL3 (GOWN DISPOSABLE) ×4 IMPLANT
GOWN STRL REUS W/ TWL XL LVL3 (GOWN DISPOSABLE) ×2 IMPLANT
GOWN STRL REUS W/TWL LRG LVL3 (GOWN DISPOSABLE) ×4
GOWN STRL REUS W/TWL XL LVL3 (GOWN DISPOSABLE) ×2
IRRIGATION STRYKERFLOW (MISCELLANEOUS) ×2 IMPLANT
IRRIGATOR STRYKERFLOW (MISCELLANEOUS) ×4
IV LACTATED RINGERS 1000ML (IV SOLUTION) ×4 IMPLANT
IV NS 1000ML (IV SOLUTION) ×2
IV NS 1000ML BAXH (IV SOLUTION) ×2 IMPLANT
KIT PINK PAD W/HEAD ARE REST (MISCELLANEOUS) ×4
KIT PINK PAD W/HEAD ARM REST (MISCELLANEOUS) ×2 IMPLANT
KIT RM TURNOVER CYSTO AR (KITS) ×4 IMPLANT
LABEL OR SOLS (LABEL) ×4 IMPLANT
LIGASURE VESSEL 5MM BLUNT TIP (ELECTROSURGICAL) ×4 IMPLANT
MANIPULATOR VCARE SML CRV RETR (MISCELLANEOUS) ×4 IMPLANT
NS IRRIG 500ML POUR BTL (IV SOLUTION) ×4 IMPLANT
OCCLUDER COLPOPNEUMO (BALLOONS) ×4 IMPLANT
PACK GYN LAPAROSCOPIC (MISCELLANEOUS) ×4 IMPLANT
PAD OB MATERNITY 4.3X12.25 (PERSONAL CARE ITEMS) ×4 IMPLANT
PAD PREP 24X41 OB/GYN DISP (PERSONAL CARE ITEMS) ×4 IMPLANT
SCISSORS METZENBAUM CVD 33 (INSTRUMENTS) ×4 IMPLANT
SET CYSTO W/LG BORE CLAMP LF (SET/KITS/TRAYS/PACK) ×4 IMPLANT
SLEEVE ENDOPATH XCEL 5M (ENDOMECHANICALS) ×4 IMPLANT
SPONGE VERSALON 2X2 STRL (MISCELLANEOUS) ×4
STRIP CLOSURE SKIN 1/4X4 (GAUZE/BANDAGES/DRESSINGS) ×3 IMPLANT
SURGILUBE 2OZ TUBE FLIPTOP (MISCELLANEOUS) ×4 IMPLANT
SUT ENDO VLOC 180-0-8IN (SUTURE) ×4 IMPLANT
SUT MNCRL 4-0 (SUTURE) ×2
SUT MNCRL 4-0 27XMFL (SUTURE) ×2
SUT MNCRL AB 4-0 PS2 18 (SUTURE) ×4 IMPLANT
SUT VIC AB 0 CT1 36 (SUTURE) ×4 IMPLANT
SUT VIC AB 2-0 UR6 27 (SUTURE) ×4 IMPLANT
SUT VIC AB 4-0 SH 27 (SUTURE) ×2
SUT VIC AB 4-0 SH 27XANBCTRL (SUTURE) ×2 IMPLANT
SUTURE MNCRL 4-0 27XMF (SUTURE) ×2 IMPLANT
SWABSTK COMLB BENZOIN TINCTURE (MISCELLANEOUS) ×4 IMPLANT
SYR 10ML LL (SYRINGE) ×4 IMPLANT
SYR 50ML LL SCALE MARK (SYRINGE) ×4 IMPLANT
TROCAR XCEL NON-BLD 11X100MML (ENDOMECHANICALS) ×4 IMPLANT
TROCAR XCEL NON-BLD 5MMX100MML (ENDOMECHANICALS) ×8 IMPLANT
TUBING INSUF HEATED (TUBING) ×4 IMPLANT
TUBING INSUFFLATION (TUBING) ×4 IMPLANT

## 2017-12-02 NOTE — Anesthesia Preprocedure Evaluation (Addendum)
Anesthesia Evaluation  Patient identified by MRN, date of birth, ID band Patient awake    Reviewed: Allergy & Precautions, NPO status , Patient's Chart, lab work & pertinent test results, reviewed documented beta blocker date and time   Airway Mallampati: III  TM Distance: >3 FB     Dental  (+) Chipped, Edentulous Upper, Partial Lower, Missing   Pulmonary asthma , former smoker,           Cardiovascular      Neuro/Psych  Headaches, PSYCHIATRIC DISORDERS Bipolar Disorder    GI/Hepatic GERD  Controlled,  Endo/Other    Renal/GU Renal disease     Musculoskeletal  (+) Arthritis ,   Abdominal   Peds  Hematology   Anesthesia Other Findings   Reproductive/Obstetrics                            Anesthesia Physical Anesthesia Plan  ASA: II  Anesthesia Plan: General   Post-op Pain Management:    Induction: Intravenous  PONV Risk Score and Plan:   Airway Management Planned: Oral ETT  Additional Equipment:   Intra-op Plan:   Post-operative Plan:   Informed Consent: I have reviewed the patients History and Physical, chart, labs and discussed the procedure including the risks, benefits and alternatives for the proposed anesthesia with the patient or authorized representative who has indicated his/her understanding and acceptance.     Plan Discussed with: CRNA  Anesthesia Plan Comments:         Anesthesia Quick Evaluation

## 2017-12-02 NOTE — OR Nursing (Signed)
Patient advises pain 8/10 after receiving oxicodone postop and asks to see MD.  Notified patient will notify MD when she is out of surgery of same.

## 2017-12-02 NOTE — OR Nursing (Signed)
Pain 2/10 - up to BR 1425 for 100cc void, post void bladder scan = 66ml.  Msg left w/labor&delivery nurse for Dr. Dalbert GarnetBeasley to call us when available.

## 2017-12-02 NOTE — Anesthesia Procedure Notes (Signed)
Procedure Name: Intubation Date/Time: 12/02/2017 8:00 AM Performed by: Eben Burow, CRNA Pre-anesthesia Checklist: Patient identified, Emergency Drugs available, Suction available, Patient being monitored and Timeout performed Patient Re-evaluated:Patient Re-evaluated prior to induction Oxygen Delivery Method: Circle system utilized Preoxygenation: Pre-oxygenation with 100% oxygen Induction Type: IV induction Ventilation: Mask ventilation without difficulty Laryngoscope Size: Mac and 3 Grade View: Grade I Tube type: Oral Tube size: 7.0 mm Number of attempts: 1 Airway Equipment and Method: LTA kit utilized Placement Confirmation: ETT inserted through vocal cords under direct vision,  positive ETCO2 and breath sounds checked- equal and bilateral Secured at: 22 cm Tube secured with: Tape Dental Injury: Teeth and Oropharynx as per pre-operative assessment

## 2017-12-02 NOTE — Interval H&P Note (Signed)
History and Physical Interval Note:  12/02/2017 7:47 AM  Olivia Walter  has presented today for surgery, with the diagnosis of Pelvic pain  The various methods of treatment have been discussed with the patient and family. After consideration of risks, benefits and other options for treatment, the patient has consented to  Procedure(s): HYSTERECTOMY TOTAL LAPAROSCOPIC (N/A) LAPAROSCOPIC BILATERAL SALPINGECTOMY (Bilateral) CYSTOSCOPY (N/A) with hydrodistention and possible L oopherectomy as a surgical intervention .  The patient's history has been reviewed, patient examined, no change in status, stable for surgery.  I have reviewed the patient's chart and labs.  Questions were answered to the patient's satisfaction.     Christeen DouglasBethany Hadley [place]au

## 2017-12-02 NOTE — Transfer of Care (Signed)
Immediate Anesthesia Transfer of Care Note  Patient: Olivia Walter  Procedure(s) Performed: HYSTERECTOMY TOTAL LAPAROSCOPIC (N/A ) LAPAROSCOPIC BILATERAL SALPINGECTOMY (Bilateral ) CYSTOSCOPY (N/A )  Patient Location: PACU  Anesthesia Type:General  Level of Consciousness: awake, alert , oriented and patient cooperative  Airway & Oxygen Therapy: Patient Spontanous Breathing and Patient connected to face mask oxygen  Post-op Assessment: Report given to RN and Post -op Vital signs reviewed and stable  Post vital signs: Reviewed and stable  Last Vitals:  Vitals:   12/02/17 0622 12/02/17 1004  BP: 117/77 (!) 124/94  Pulse: 98 95  Resp: 17 20  Temp: (!) 36.4 C 36.6 C  SpO2: 100% 100%    Last Pain:  Vitals:   12/02/17 0622  TempSrc: Oral  PainSc: 5          Complications: No apparent anesthesia complications

## 2017-12-02 NOTE — OR Nursing (Signed)
Dr. Dalbert GarnetBeasley in to see pt, pt advises she feels "much better now" but some nausea after getting up to  BR - 500 I/O after surgery - MD aware.  Pt advises she wants to go home.  MD advises she wants pt to void prior to d/c to home.  Phenergan 6.25mg  IV will be given as ordered.

## 2017-12-02 NOTE — Op Note (Signed)
Olivia Walter PROCEDURE DATE: 12/02/2017  PREOPERATIVE DIAGNOSIS:  POSTOPERATIVE DIAGNOSIS: The same PROCEDURE: Total laparoscopic hysterectomy with vaginal closure, bilateral salpingectomy, left oopherectomy, cystoscopy with hydrodistention, lysis of adhesions SURGEON:  Dr. Christeen DouglasBethany Dayonna Selbe ASSISTANT: Dr. Leeroy Bockhelsea Ward Anesthesiologist:  Anesthesiologist: Berdine Addisonhomas, Mathai, MD CRNA: Dava NajjarFrazier, Susan, CRNA; Karoline CaldwellStarr, Deana, CRNA  INDICATIONS: 32 y.o. F  here for definitive surgical management secondary to chronic pelvic pain and abnormal uterine bleeding, with left side greater than right sided pain; please see preoperative note for further details.  Risks of surgery were discussed with the patient including but not limited to: bleeding which may require transfusion or reoperation; infection which may require antibiotics; injury to bowel, bladder, ureters or other surrounding organs; need for additional procedures; thromboembolic phenomenon, incisional problems and other postoperative/anesthesia complications. Written informed consent was obtained.    FINDINGS:  Small uterus, normal vagina, normal bilateral fallopian tubes and ovaries. Significant adhesions between left sigmoid and left pelvic sidewall- these were removed  ANESTHESIA:    General INTRAVENOUS FLUIDS:600  ml ESTIMATED BLOOD LOSS:10 ml URINE OUTPUT: 500 ml   SPECIMENS: Uterus, cervix, bilateral fallopian tubes and left ovary COMPLICATIONS: None immediate  PROCEDURE IN DETAIL:  The patient received prophalactic intravenous antibiotics and had sequential compression devices applied to her lower extremities while in the preoperative area.  She was then taken to the operating room where general anesthesia was administered and was found to be adequate.  She was placed in the dorsal lithotomy position, and was prepped and draped in a sterile manner.  A formal time out was performed with all team members present and in agreement.  A V-care  uterine manipulator was placed at this time.  A Foley catheter was inserted into her bladder and attached to constant drainage. Attention was turned to the abdomen where an umbilical incision was made with the scalpel.  The Optiview 10-mm trocar and sleeve were then advanced without difficulty with the laparoscope under direct visualization into the abdomen.  The abdomen was then insufflated with carbon dioxide gas and adequate pneumoperitoneum was obtained.  A survey of the patient's pelvis and abdomen revealed the findings above.  Bilateral lower quadrant ports (5 mm on the right and 5 mm on the left) were then placed under direct visualization.  The pelvis was then carefully examined. The adhesions at the sigmoid were taken down bluntly.   Attention was turned to the left IP ligament. This was fulgurated and ligated, freeing the ovary from the pelvic sidewall. The broad ligament was transected and the anterior and posterior leaflets of the broad opened. The uterine artery was then skeletonized and a bladder flap was created.  The ureters were noted to be safely away from the area of dissection.  The bladder was then bluntly dissected off the lower uterine segment.    At this point, attention was turned to the uterine vessels, which were clamped and ligated using the Ligasure.  Good hemostasis was noted overall.  The uterosacral and cardinal ligaments were clamped, cut and ligated bilaterally .  Attention was then turned to the cervicovaginal junction, and the bipolar scissors were used to transect the cervix from the surrounding vagina using the ring of the V-care as a guide. This was done circumferentially allowing total hysterectomy.  The uterus was then removed from the vagina and the vaginal cuff incision was then closed with running 0 V-lock from above.  Overall excellent hemostasis was noted.    Attention was returned to the abdomen.The ureters were reexamined  bilaterally and were pulsating normally.  The abdominal pressure was reduced and hemostasis was confirmed.   Intravenous floruoceine was administered, and cystoscopy showed bilateral ureteral jets.  No stitches were visualized in the bladder during cystoscopy. of water was infused into the bladder for hydrodistention. All trocars were removed under direct visualization, and the abdomen was desufflated.  The fascial incision of the umbilicus was closed with a 0 Vicryl figure of eight stitch.  All skin incisions were closed with 4-0 Vicryl subcuticular stitches and Dermabond. The patient tolerated the procedures well.  All instruments, needles, and sponge counts were correct x 2. The patient was taken to the recovery room awake, extubated and in stable condition.

## 2017-12-02 NOTE — OR Nursing (Signed)
Per Frances MaywoodM Bennett, RN, Dr. Dalbert GarnetBeasley returned call approx 1730 and she notified her pt's pain contolled and postop void x 2 and patient wanted to go home.  MD advises ok she was feeling better and was ok to send home.

## 2017-12-02 NOTE — Anesthesia Postprocedure Evaluation (Signed)
Anesthesia Post Note  Patient: Luciana AxeMelanie Joyce Vought  Procedure(s) Performed: HYSTERECTOMY TOTAL LAPAROSCOPIC (N/A ) LAPAROSCOPIC BILATERAL SALPINGECTOMY (Bilateral ) CYSTOSCOPY (N/A )  Patient location during evaluation: PACU Anesthesia Type: General Level of consciousness: awake and alert Pain management: pain level controlled Vital Signs Assessment: post-procedure vital signs reviewed and stable Respiratory status: spontaneous breathing, nonlabored ventilation, respiratory function stable and patient connected to nasal cannula oxygen Cardiovascular status: blood pressure returned to baseline and stable Postop Assessment: no apparent nausea or vomiting Anesthetic complications: no     Last Vitals:  Vitals:   12/02/17 1118 12/02/17 1130  BP: 119/86 108/65  Pulse: (!) 108 96  Resp: 19 18  Temp: 36.5 C (!) 36.3 C  SpO2: 98% 97%    Last Pain:  Vitals:   12/02/17 1130  TempSrc:   PainSc: 7                  Katiya Fike S

## 2017-12-02 NOTE — Discharge Instructions (Addendum)
Discharge instructions after   total laparoscopic hysterectomy   For the next three days, take ibuprofen and acetaminophen on a schedule, every 8 hours. You can take them together or you can intersperse them, and take one every four hours. I also gave you gabapentin for nighttime, to help you sleep and also to control pain. Take gabapentin medicines at night for at least the next 3 nights. You also have a narcotic, oxycodone, to take as needed if the above medicines don't help.  Postop constipation is a major cause of pain. Stay well hydrated, walk as you tolerate, and take over the counter senna as well as stool softeners if you need them.    Signs and Symptoms to Report Call our office at [phone] if you have any of the following.  . Fever over 100.4 degrees or higher . Severe stomach pain not relieved with pain medications . Bright red bleeding that's heavier than a period that does not slow with rest . To go the bathroom a lot (frequency), you can't hold your urine (urgency), or it hurts when you empty your bladder (urinate) . Chest pain . Shortness of breath . Pain in the calves of your legs . Severe nausea and vomiting not relieved with anti-nausea medications . Signs of infection around your wounds, such as redness, hot to touch, swelling, green/yellow drainage (like pus), bad smelling discharge . Any concerns  What You Can Expect after Surgery . You may see some pink tinged, bloody fluid and bruising around the wound. This is normal. . You may notice shoulder and neck pain. This is caused by the gas used during surgery to expand your abdomen so your surgeon could get to the uterus easier. . You may have a sore throat because of the tube in your mouth during general anesthesia. This will go away in 2 to 3 days. . You may have some stomach cramps. . You may notice spotting on your panties. . You may have pain around the incision sites.   Activities after Your  Discharge Follow these guidelines to help speed your recovery at home: . Do the coughing and deep breathing as you did in the hospital for 2 weeks. Use the small blue breathing device, called the incentive spirometer for 2 weeks. . Don't drive if you are in pain or taking narcotic pain medicine. You may drive when you can safely slam on the brakes, turn the wheel forcefully, and rotate your torso comfortably. This is typically 1-2 weeks. Practice in a parking lot or side street prior to attempting to drive regularly.  . Ask others to help with household chores for 4 weeks. . Do not lift anything heavier that 10 pounds for 4-6 weeks. This includes pets, children, and groceries. . Don't do strenuous activities, exercises, or sports like vacuuming, tennis, squash, etc. until your doctor says it is safe to do so. ---Maintain pelvic rest for 8 weeks. This means nothing in the vagina or rectum at all (no douching, tampons, intercourse) for 8 weeks.  . Walk as you feel able. Rest often since it may take two or three weeks for your energy level to return to normal.  . You may climb stairs . Avoid constipation:   -Eat fruits, vegetables, and whole grains. Eat small meals as your appetite will take time to return to normal.   -Drink 6 to 8 glasses of water each day unless your doctor has told you to limit your fluids.   -Use a laxative   or stool softener as needed if constipation becomes a problem. You may take Miralax, metamucil, Citrucil, Colace, Senekot, FiberCon, etc. If this does not relieve the constipation, try two tablespoons of Milk Of Magnesia every 8 hours until your bowels move.  . You may shower. Gently wash the wounds with a mild soap and water. Pat dry. . Do not get in a hot tub, swimming pool, etc. for 6 weeks. . Do not use lotions, oils, powders on the wounds. . Do not douche, use tampons, or have sex until your doctor says it is okay. . Take your pain medicine when you need it. The medicine  may not work as well if the pain is bad.  Take the medicines you were taking before surgery. Other medications you will need are pain medications and possibly constipation and nausea medications (Zofran).     Here is a helpful article from the website [employer], regarding constipation  Here are reasons why constipation occurs after surgery: 1) During the operation and in the recovery room, most people are given opioid pain medication, primarily through an IV, to treat moderate or severe pain. Intravenous opioids include morphine, Dilaudid and fentanyl. After surgery, patients are often prescribed opioid pain medication to take by mouth at home, including codeine, Vicodin, Norco, and Percocet. All of these medications cause constipation by slowing down the movement of your intestine. 2) Changes in your diet before surgery can be another culprit. It is common to get specific instructions to change how you normally eat or drink before your surgery, like only having liquids the day before or not having anything to eat or drink after midnight the night before surgery. For this reason, temporary dehydration may occur. This, along with not eating or only having liquids, means that you are getting less fiber than usual. Both these factors contribute to constipation. 3) Changes in your diet after surgery can also contribute to the problem. Although many people don't have dietary restrictions after operations, being under anesthesia can make you lose your appetite for several hours and maybe even days. Some people can even have nausea or vomiting. Not eating or drinking normally means that you are not getting enough fiber and you can get dehydrated, both leading to constipation. 4) Lying in a bed more than usual--which happens before, during and after surgery--combined with the medications and diet changes, all work together to slow down your colon and make your poop turn to rock.  No one likes to be  constipated.  Let's face it, it's not a pleasant feeling when you don't poop for days, then strain on the toilet to finally pass something large enough to cause damage. An ounce of prevention is worth a pound of cure, so: 1. Assume you will be constipated. 2. Plan and prepare accordingly. Post-surgery is one of those unique situations where the temporary use of laxatives can make a world of difference. Always consult with your doctor, and recognize that if you wait several days after surgery to take a laxative, the constipation might be too severe for these over-the-counter options. It is always important to discuss all medications you plan on taking with your doctor. Ask your doctor if you can start the laxative immediately after surgery. *  Here are go-to post-surgery laxatives: Senna: Senna is an herb that acts as a "stimulant laxative," meaning it increases the activity of the intestine to cause you to have a bowel movement. It comes in many forms, but senna pills are easy to take   and are sold over the counter at almost all pharmacies. Since opioid pain medications slow down the activity of the intestine, it makes sense to take a medication to help reverse that side effect. Long-term use of a stimulant laxative is not a good idea since it can make your colon "lazy" and not function properly; however, temporary use immediately after surgery is acceptable. In general, if you are able to eat a normal diet, taking senna soon after surgery works the best. Senna usually works within hours to produce a bowel movement, but this is less predictable when you are taking different medications after surgery. Try not to wait several days to start taking senna, as often it is too late by then. Just like with all medications or supplements, check with your doctor before starting new treatment.   Magnesium: Magnesium is an important mineral that our body needs. We get magnesium from some foods that we eat, especially  foods that are high in fiber such as broccoli, almonds and whole grains. There are also magnesium-based medications used to treat constipation including milk of magnesia (magnesium hydroxide), magnesium citrate and magnesium oxide. They work by drawing water into the intestine, putting it into the class of "osmotic" laxatives. Magnesium products in low doses appear to be safe, but if taken in very large doses, can lead to problems such as irregular heartbeat, low blood pressure and even death. It can also affect other medications you might be taking, therefore it is important to discuss using magnesium with your physician and pharmacist before initiating therapy. Most over-the-counter magnesium laxatives work very well to help with the constipation related to surgery, but sometimes they work too well and lead to diarrhea. Make sure you are somewhere with easy access to a bathroom, just in case.   Bisacodyl: Bisacodyl (generic name) is sold under brand names such as Dulcolax. Much like senna, it is a "stimulant laxative," meaning it makes your intestines move more quickly to push out the stool. This is another good choice to start taking as soon as your doctor says you can take a laxative after surgery. It comes in pill form and as a suppository, which is a good choice for people who cannot or are not allowed to swallow pills. Studies have shown that it works as a laxative, but like most of these medications, you should use this on a short-term basis only.   Enema: Enemas strike fear in many people, but FEAR NOT! It's nowhere near as big a deal as you may think. An enema is just a way to get some liquid into your rectum by placing a specially designed device through your anus. If you have never done one, it might seem like a painful, unpleasant, uncomfortable, complicated and lengthy procedure. But in reality, it's simple, takes just a few seconds and is highly effective. The small ready-made bottles you buy at  the pharmacy are much easier than the hose/large rubber container type. Those recommended positions illustrated in some instructions are generally not necessary to place the enema. It's very similar to the insertion of a tampon, requiring a slight squat. Some extra lubrication on the enema's tip (or on your anus) will make it a breeze. In certain cases, there is no substitute for a good enema. For example, if someone has not pooped for a few days, the beginning of the poop waiting to come out can become rock hard. Passing that hard stool can lead to much pain and problems like anal fissures.   Inserting a little liquid to break up the rock-hard stool will help make its passage much easier. Enemas come with different liquids. Most come with saline, but there are also mineral oil options. You can also use warm water in the reusable enema containers. They all work. But since saline can sometimes be irritating, so try a mineral oil or water enema instead.  Here are commonly recommended constipation medications that do not work well for post-surgery constipation: Docusate: Docusate (generic name) most commonly referred to as Colace (brand name) is not really a laxative, but is classified as a stool softener. Although this medication is commonly prescribed, it is not recommended for several reasons: 1) there is no good medical evidence that it works 2) even if it has an effect, which is very questionable, it is minimal and cannot combat the intestinal slowing caused by the opioid medications. Skip docusate to save money and space in your pillbox for something more effective.  PEG: Miralax (brand name) is basically a chemical called polyethylene glycol (PEG) and it has gained tremendous popularity as a laxative. This product is an "osmotic laxative" meaning it works by pulling water into the stool, making it softer. This is very similar to the action of natural fiber in foods and supplements. Therefore, the effect seen  by this medication is not immediate, causing a bowel movement in a day or more. Is this medication strong enough to battle the constipation related to having an operation? Maybe for some people not prone to constipation. But for most people, other laxatives are better to prevent constipation after surgery.   AMBULATORY SURGERY  DISCHARGE INSTRUCTIONS   1) The drugs that you were given will stay in your system until tomorrow so for the next 24 hours you should not:  A) Drive an automobile B) Make any legal decisions C) Drink any alcoholic beverage   2) You may resume regular meals tomorrow.  Today it is better to start with liquids and gradually work up to solid foods.  You may eat anything you prefer, but it is better to start with liquids, then soup and crackers, and gradually work up to solid foods.   3) Please notify your doctor immediately if you have any unusual bleeding, trouble breathing, redness and pain at the surgery site, drainage, fever, or pain not relieved by medication.    4) Additional Instructions:        Please contact your physician with any problems or Same Day Surgery at [phone], Monday through Friday 6 am to 4 pm, or [facility] number at [phone].  

## 2017-12-02 NOTE — Anesthesia Post-op Follow-up Note (Signed)
Anesthesia QCDR form completed.        

## 2017-12-02 NOTE — OR Nursing (Signed)
2nd postop void 550cc @ 1530, pt anxious, advises "I'm having a bipolar issue and want to go  Home".  Will notify Dr. Dalbert GarnetBeasley of same after her case upstairs.

## 2017-12-03 LAB — SURGICAL PATHOLOGY

## 2018-02-06 ENCOUNTER — Other Ambulatory Visit: Payer: Self-pay

## 2018-02-06 ENCOUNTER — Emergency Department
Admission: EM | Admit: 2018-02-06 | Discharge: 2018-02-06 | Disposition: A | Discharge status: Home / Self Care | Payer: BLUE CROSS/BLUE SHIELD | Attending: Emergency Medicine | Admitting: Emergency Medicine

## 2018-02-06 ENCOUNTER — Emergency Department: Payer: BLUE CROSS/BLUE SHIELD

## 2018-02-06 DIAGNOSIS — R0789 Other chest pain: Secondary | ICD-10-CM | POA: Insufficient documentation

## 2018-02-06 DIAGNOSIS — Z79899 Other long term (current) drug therapy: Secondary | ICD-10-CM | POA: Insufficient documentation

## 2018-02-06 DIAGNOSIS — R079 Chest pain, unspecified: Secondary | ICD-10-CM

## 2018-02-06 DIAGNOSIS — J45909 Unspecified asthma, uncomplicated: Secondary | ICD-10-CM | POA: Diagnosis not present

## 2018-02-06 DIAGNOSIS — Z87891 Personal history of nicotine dependence: Secondary | ICD-10-CM | POA: Insufficient documentation

## 2018-02-06 LAB — TROPONIN I

## 2018-02-06 LAB — BASIC METABOLIC PANEL
ANION GAP: 10 (ref 5–15)
BUN: <5 mg/dL — ABNORMAL LOW (ref 6–20)
CALCIUM: 9.2 mg/dL (ref 8.9–10.3)
CO2: 24 mmol/L (ref 22–32)
Chloride: 102 mmol/L (ref 101–111)
Creatinine, Ser: 0.79 mg/dL (ref 0.44–1.00)
Glucose, Bld: 105 mg/dL — ABNORMAL HIGH (ref 65–99)
POTASSIUM: 3.2 mmol/L — AB (ref 3.5–5.1)
Sodium: 136 mmol/L (ref 135–145)

## 2018-02-06 LAB — CBC
HCT: 40.1 % (ref 35.0–47.0)
HEMOGLOBIN: 14.3 g/dL (ref 12.0–16.0)
MCH: 32 pg (ref 26.0–34.0)
MCHC: 35.6 g/dL (ref 32.0–36.0)
MCV: 90.1 fL (ref 80.0–100.0)
Platelets: 376 10*3/uL (ref 150–440)
RBC: 4.45 MIL/uL (ref 3.80–5.20)
RDW: 14.3 % (ref 11.5–14.5)
WBC: 8.6 10*3/uL (ref 3.6–11.0)

## 2018-02-06 LAB — GLUCOSE, CAPILLARY: GLUCOSE-CAPILLARY: 103 mg/dL — AB (ref 65–99)

## 2018-02-06 NOTE — ED Notes (Signed)
Patient transported to X-ray 

## 2018-02-06 NOTE — ED Triage Notes (Signed)
Pt states that she has been having chest pain today, weakness. Potassium 2.7 on Tuesday. Pt ambulatory.   Pt was prescribed potassium tablets, but has not had it filled.

## 2018-02-06 NOTE — ED Provider Notes (Signed)
[facility] Emergency Department Provider Note  ____________________________________________   First MD Initiated Contact with Patient 02/06/18 1411     (approximate)  I have reviewed the triage vital signs and the nursing notes.   HISTORY  Chief Complaint Chest Pain and Abnormal Lab   HPI Olivia Walter is a 32 y.o. female with history of bipolar disorder as well as asthma and chronic lower abdominal pain was presenting to the emergency department today with chest pressure over the past 24 hours.  She says the pressure started yesterday and is to the right of center of her chest.  She denies any radiation.  Denies any shortness of breath to me.  Denies any worsening with deep breathing.  Says that she has associated mild diffuse body aches and was recently found to have a low potassium of 2.7 and was prescribed potassium supplementation but has not been able to get from the pharmacy as of yet.  Patient denies smoking or drug use.  She says since arriving in the emergency department that the pain has reduced significantly.  Denies any nausea or vomiting.  Says that she thinks she may be anxious because of her low potassium level is concerned about this.  Denies any focal weakness.  No history of sudden death in young ages in her family although the people do have history of heart disease in their 27s.  Patient is not on any hormone supplementation such as birth control.  Past Medical History:  Diagnosis Date  . Asthma    childhood asthma  . Bipolar disorder (HCC)   . GERD (gastroesophageal reflux disease)   . Headache    Migraines  . History of kidney stones   . History of stomach ulcers   . Kidney calculus   . Kidney stones   . Osteoarthritis   . Reactive hypoglycemia     There are no active problems to display for this patient.   Past Surgical History:  Procedure Laterality Date  . CAPSULE ENDOSCOPY    . COLONOSCOPY    . cyst removed on wrist     . CYSTOSCOPY N/A 12/02/2017   Procedure: CYSTOSCOPY;  Surgeon: [name], MD;  Location: [facility] ORS;  Service: Gynecology;  Laterality: N/A;  . ESOPHAGOGASTRODUODENOSCOPY    . ESOPHAGOGASTRODUODENOSCOPY (EGD) WITH PROPOFOL N/A 10/01/2016   Procedure: ESOPHAGOGASTRODUODENOSCOPY (EGD) WITH PROPOFOL;  Surgeon: Christena Deem, MD;  Location: [facility] ENDOSCOPY;  Service: Endoscopy;  Laterality: N/A;  . LAPAROSCOPIC BILATERAL SALPINGECTOMY Bilateral 12/02/2017   Procedure: LAPAROSCOPIC BILATERAL SALPINGECTOMY;  Surgeon: [name], MD;  Location: [facility] ORS;  Service: Gynecology;  Laterality: Bilateral;  . LAPAROSCOPIC HYSTERECTOMY N/A 12/02/2017   Procedure: HYSTERECTOMY TOTAL LAPAROSCOPIC;  Surgeon: [name], MD;  Location: [facility] ORS;  Service: Gynecology;  Laterality: N/A;  . MOUTH SURGERY      Prior to Admission medications   Medication Sig Start Date End Date Taking? Authorizing Provider  aspirin-acetaminophen-caffeine (EXCEDRIN MIGRAINE) [phone] MG tablet Take 2 tablets by mouth every 6 (six) hours as needed for headache.     [provider]  diphenhydrAMINE (BENADRYL) 25 mg capsule Take 50 mg by mouth at bedtime.    [provider]  docusate sodium (COLACE) 100 MG capsule Take 1 capsule (100 mg total) by mouth 2 (two) times daily. To keep stools soft 12/02/17   [name], MD  gabapentin (NEURONTIN) 800 MG tablet Take 1 tablet (800 mg total) by mouth at bedtime for 3 days. 12/02/17 12/05/17  Dalbert Garnet,  Toma CopierBethany, MD  ibuprofen (ADVIL,MOTRIN) 800 MG tablet Take 1 tablet (800 mg total) by mouth every 8 (eight) hours as needed for moderate pain. 12/02/17   Christeen DouglasBeasley, Bethany, MD  MAGNESIUM-POTASSIUM PO Take 1 tablet by mouth daily as needed (migraines).     [provider]  oxycodone (OXY-IR) 5 MG capsule Take 1 capsule (5 mg total) by mouth every 6 (six) hours as needed for pain. 12/02/17   Christeen DouglasBeasley, Bethany, MD  promethazine (PHENERGAN) 25 MG tablet Take 1 tablet  (25 mg total) by mouth every 6 (six) hours as needed for nausea or vomiting. 12/02/17   Christeen DouglasBeasley, Bethany, MD  SUMAtriptan (IMITREX) 6 MG/0.5ML SOLN injection Inject 6 mg into the skin every 2 (two) hours as needed for migraine or headache. May repeat in 2 hours if headache persists or recurs.    [provider]    Allergies Bee venom; Penicillins; Bactrim [sulfamethoxazole-trimethoprim]; and Clindamycin/lincomycin  Family History  Problem Relation Age of Onset  . Diabetes Mother   . Congestive Heart Failure Mother   . Hypertension Mother   . Hypertension Father   . Prostate cancer Neg Hx   . Kidney cancer Neg Hx   . Bladder Cancer Neg Hx     Social History Social History   Tobacco Use  . Smoking status: Former Smoker    Packs/day: 1.00    Types: Cigarettes    Last attempt to quit: 10/2016    Years since quitting: 1.2  . Smokeless tobacco: Never Used  . Tobacco comment: Quit in Dec 2017  Substance Use Topics  . Alcohol use: No  . Drug use: No    Review of Systems  Constitutional: No fever/chills Eyes: No visual changes. ENT: No sore throat. Cardiovascular: As above Respiratory: Denies shortness of breath. Gastrointestinal: No abdominal pain.  No nausea, no vomiting.  No diarrhea.  No constipation. Genitourinary: Negative for dysuria. Musculoskeletal: Negative for back pain. Skin: Negative for rash. Neurological: Negative for headaches, focal weakness or numbness.   ____________________________________________   PHYSICAL EXAM:  VITAL SIGNS: ED Triage Vitals [02/06/18 1223]  Enc Vitals Group     BP 119/85     Pulse Rate (!) 106     Resp 18     Temp 98.1 F (36.7 C)     Temp Source Oral     SpO2 97 %     Weight 180 lb (81.6 kg)     Height 5\' 3"  (1.6 m)     Head Circumference      Peak Flow      Pain Score 5     Pain Loc      Pain Edu?      Excl. in GC?     Constitutional: Alert and oriented. Well appearing and in no acute distress. Eyes:  Conjunctivae are normal.  Head: Atraumatic. Nose: No congestion/rhinnorhea. Mouth/Throat: Mucous membranes are moist.  Neck: No stridor.   Cardiovascular: Normal rate, regular rhythm. Grossly normal heart sounds.  Chest pain is not reproducible to palpation.  Heart rate of 84 bpm and steady on the monitor in the room. Respiratory: Normal respiratory effort.  No retractions. Lungs CTAB. Gastrointestinal: Soft and nontender. No distention.  Musculoskeletal: No lower extremity tenderness nor edema.  No joint effusions. Neurologic:  Normal speech and language. No gross focal neurologic deficits are appreciated. Skin:  Skin is warm, dry and intact. No rash noted. Psychiatric: Mood and affect are normal. Speech and behavior are normal.  ____________________________________________   LABS (  all labs ordered are listed, but only abnormal results are displayed)  Labs Reviewed  BASIC METABOLIC PANEL - Abnormal; Notable for the following components:      Result Value   Potassium 3.2 (*)    Glucose, Bld 105 (*)    BUN <5 (*)    All other components within normal limits  GLUCOSE, CAPILLARY - Abnormal; Notable for the following components:   Glucose-Capillary 103 (*)    All other components within normal limits  CBC  TROPONIN I   ____________________________________________  EKG  ED ECG REPORT I, Arelia Longest, the attending physician, personally viewed and interpreted this ECG.   Date: 02/06/2018  EKG Time: 1226  Rate: 103  Rhythm: sinus tachycardia  Axis: Normal  Intervals:none  ST&T Change: No ST segment elevation or depression.  No abnormal T wave inversion.  ____________________________________________  RADIOLOGY  Anterior density, shadowing versus atelectasis.  Will require repeat chest x-ray in several weeks ____________________________________________   PROCEDURES  Procedure(s) performed:   Procedures  Critical Care performed:    ____________________________________________   INITIAL IMPRESSION / ASSESSMENT AND PLAN / ED COURSE  Pertinent labs & imaging results that were available during my care of the patient were reviewed by me and considered in my medical decision making (see chart for details).  Differential diagnosis includes, but is not limited to, ACS, aortic dissection, pulmonary embolism, cardiac tamponade, pneumothorax, pneumonia, pericarditis, myocarditis, GI-related causes including esophagitis/gastritis, and musculoskeletal chest wall pain.   As part of my medical decision making, I reviewed the following data within the electronic MEDICAL RECORD NUMBER Old chart reviewed  Patient with a low risk Wells score of 1.5.  Very atypical presentation for PE or ACS.  Besides slight tachycardia initially the patient had a very reassuring EKG.  Now with steady heart rate in the 80s.  Advised patient that she should continue to follow up to get her medications prescribed to her by her OB/GYN recently.  Do not feel need to admit or further test in the emergency department at this time.  Negative troponin and pain going on for 24 hours.  Patient will be following up with her primary care doctor.  She is understanding of the plan and willing to comply.  Also aware of the chest x-ray finding and need for repeat x-ray. ____________________________________________   FINAL CLINICAL IMPRESSION(S) / ED DIAGNOSES  Chest pain.    NEW MEDICATIONS STARTED DURING THIS VISIT:  New Prescriptions   No medications on file     Note:  This document was prepared using Dragon voice recognition software and may include unintentional dictation errors.     Myrna Blazer, MD 02/06/18 [phone]

## 2018-06-30 ENCOUNTER — Other Ambulatory Visit: Payer: Self-pay | Admitting: Certified Nurse Midwife

## 2018-06-30 DIAGNOSIS — L539 Erythematous condition, unspecified: Secondary | ICD-10-CM

## 2018-07-01 ENCOUNTER — Other Ambulatory Visit: Payer: Self-pay | Admitting: Certified Nurse Midwife

## 2018-07-01 DIAGNOSIS — L539 Erythematous condition, unspecified: Secondary | ICD-10-CM

## 2018-07-04 ENCOUNTER — Ambulatory Visit
Admission: RE | Admit: 2018-07-04 | Discharge: 2018-07-04 | Disposition: A | Discharge status: Home / Self Care | Payer: BLUE CROSS/BLUE SHIELD | Source: Ambulatory Visit | Attending: Certified Nurse Midwife | Admitting: Certified Nurse Midwife

## 2018-07-04 DIAGNOSIS — L539 Erythematous condition, unspecified: Secondary | ICD-10-CM

## 2018-07-28 IMAGING — CR DG CHEST 2V
2 series · 2 of 2 positions shown · non-contrast
Comparison: 01/25/2015.

CLINICAL DATA: Chest pain.  Prior smoking history.

EXAM:
CHEST - 2 VIEW

[chest pa]
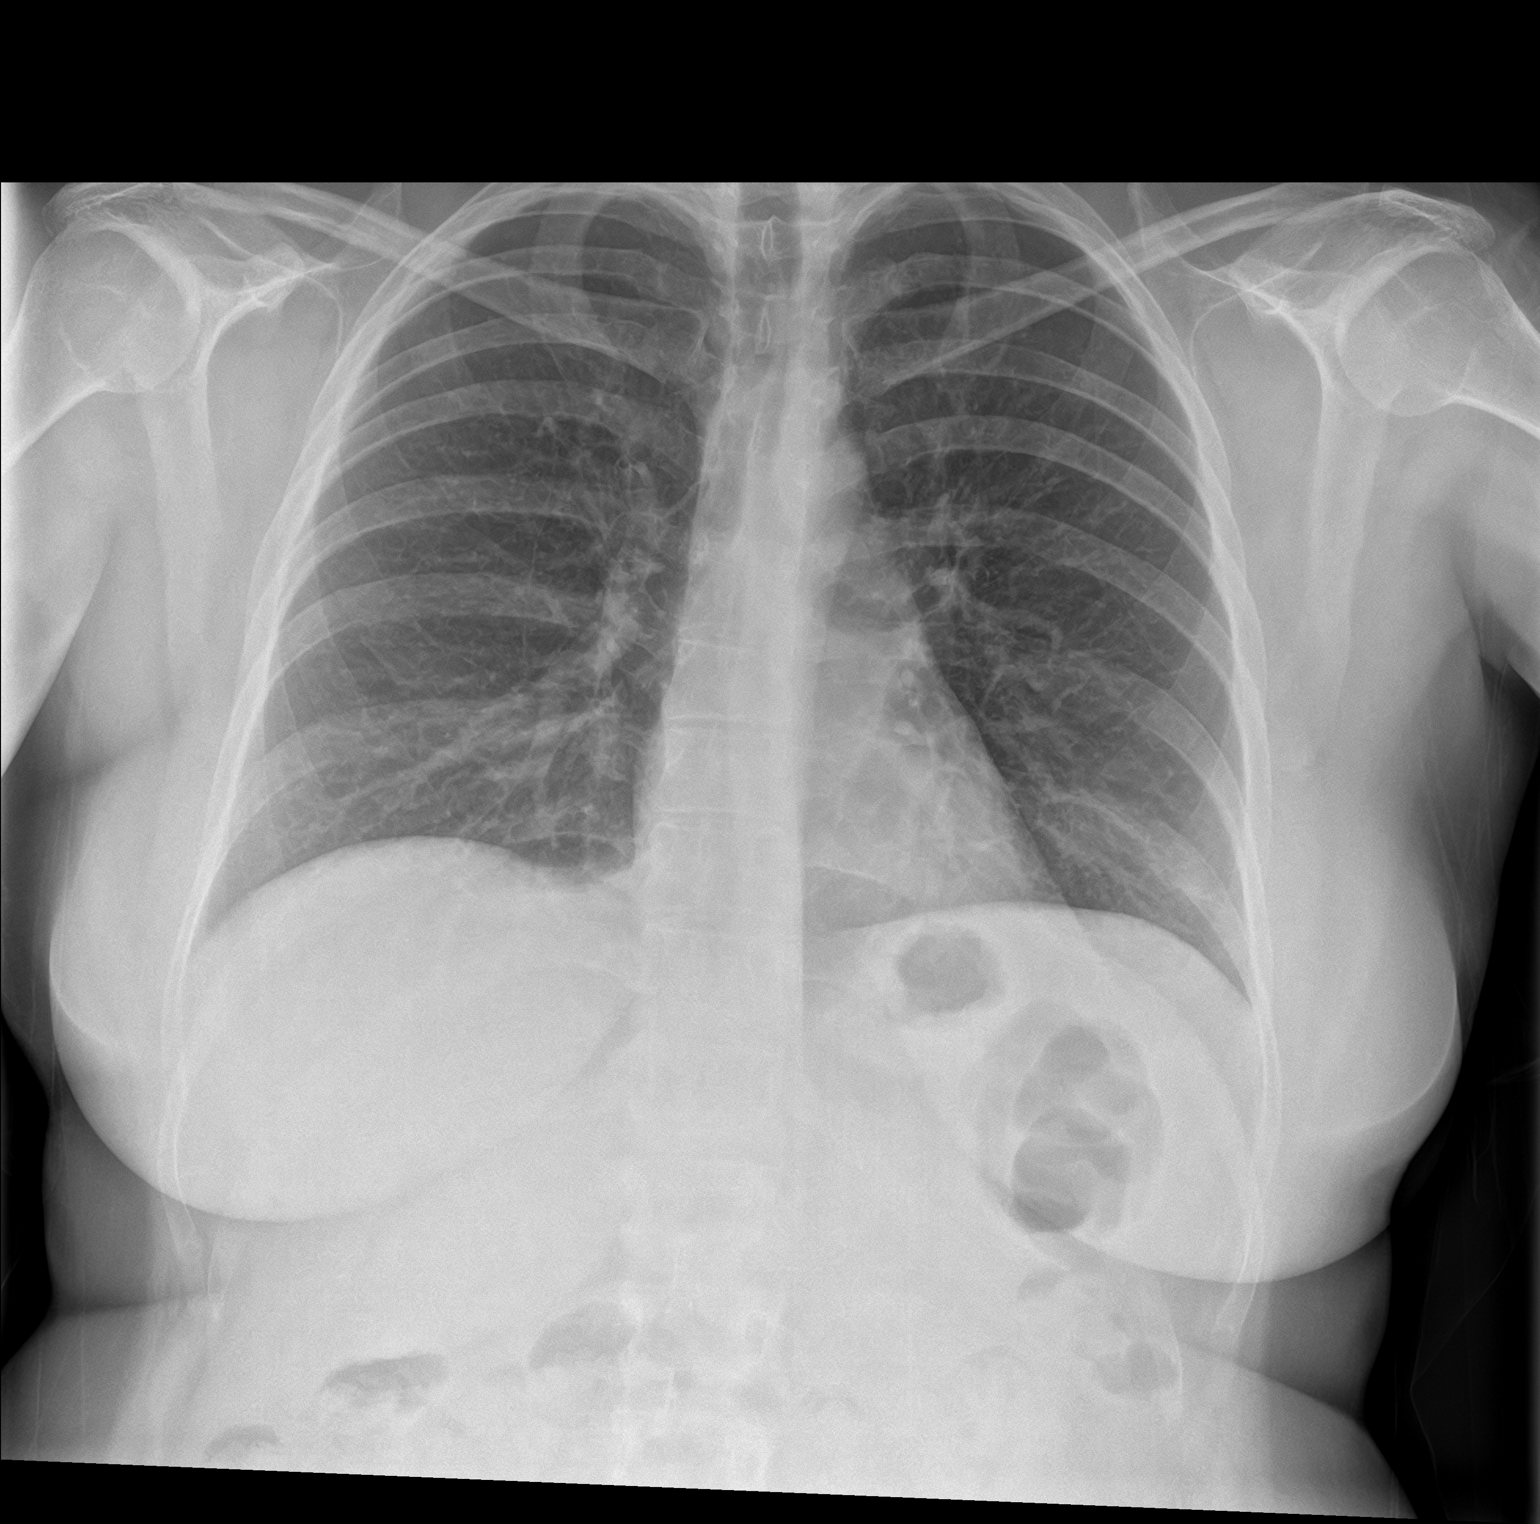

[chest lat]
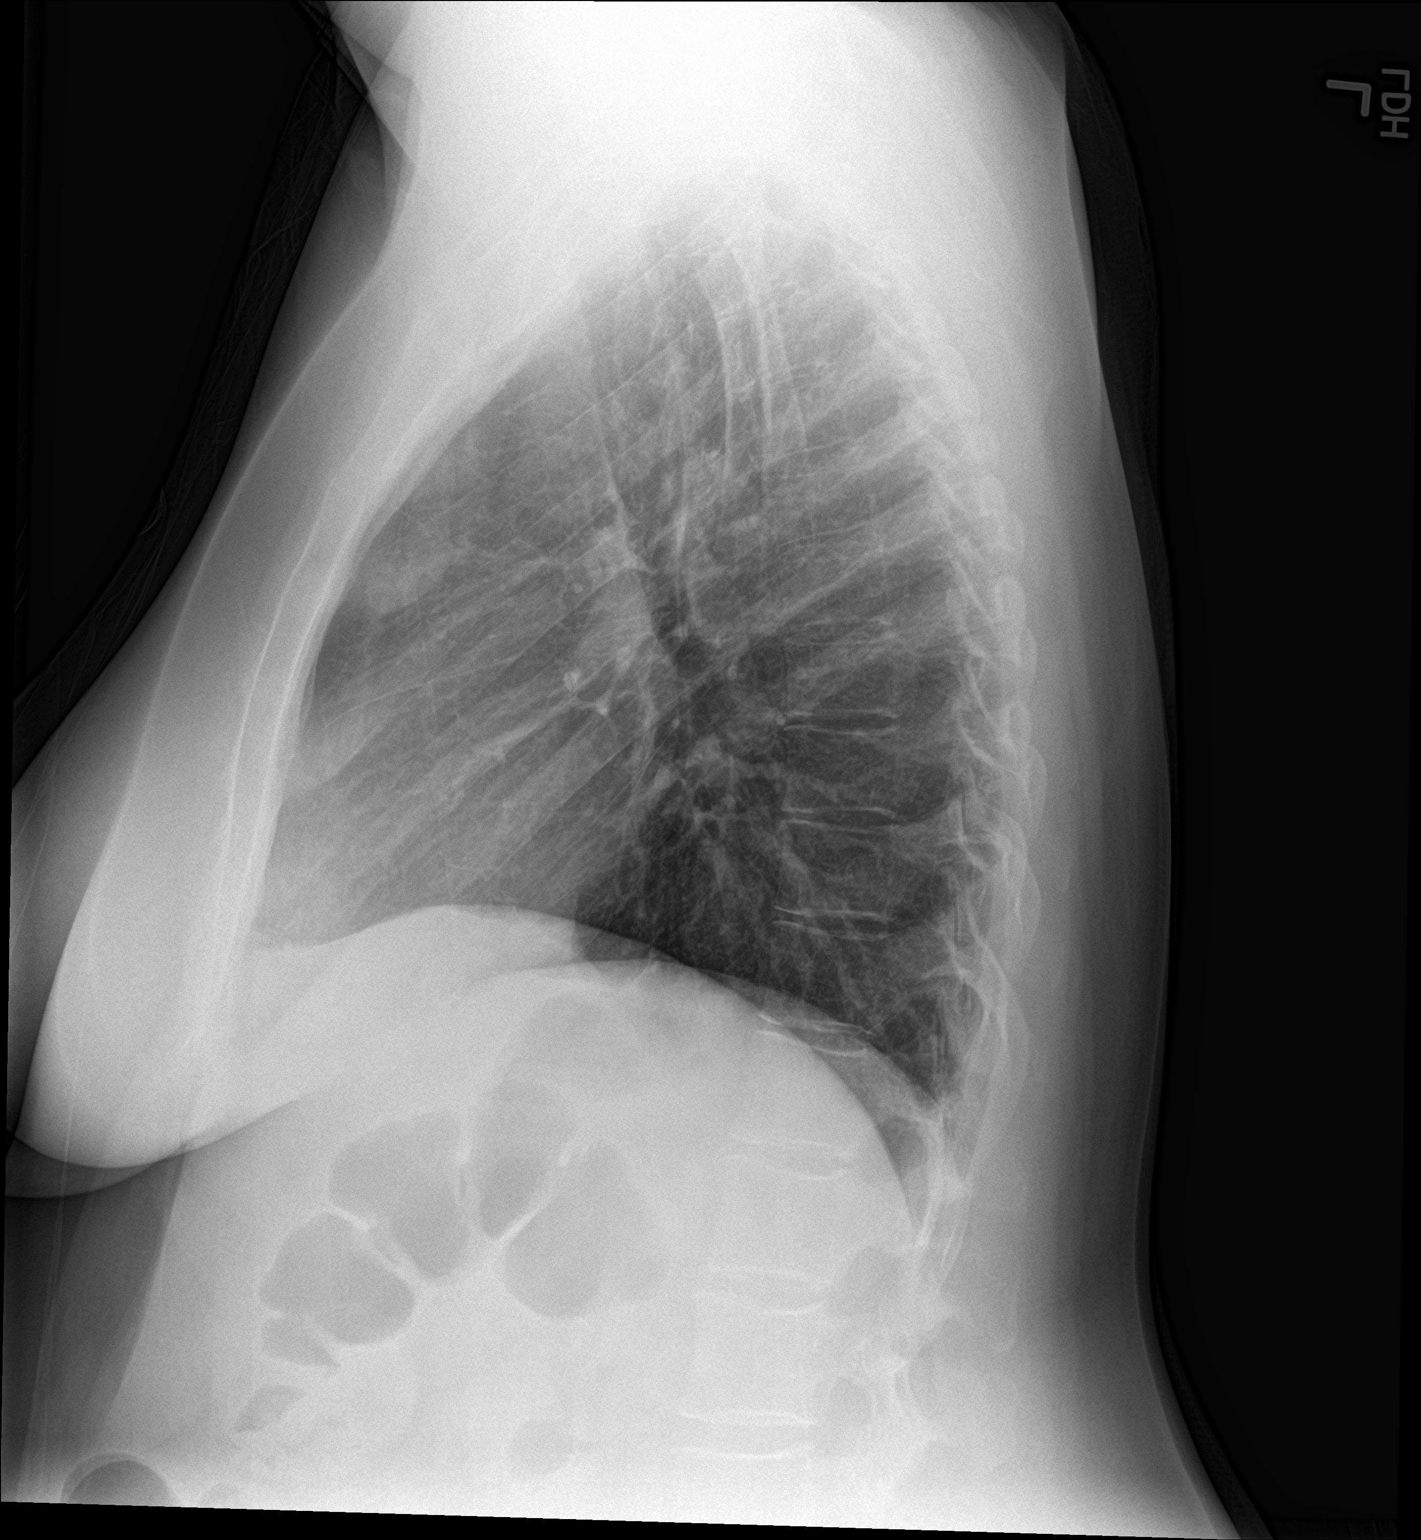

[2 of 2 positions shown; findings below may reference images not displayed]

FINDINGS: Mediastinum and hilar structures are normal. Densities noted over
the anterior chest on lateral view. This is most likely overlapping
shadows and/or mild atelectasis. Follow-up PA lateral chest x-ray
suggested to demonstrate clearing. No pleural effusion or
pneumothorax. Heart size normal. No acute bony abnormality.
IMPRESSION: Density noted in the anterior chest on lateral view only. This is
most likely overlapping shadows and/or mild atelectasis. Follow-up
PA lateral chest x-ray suggested to demonstrate resolution. No other
focal abnormality.

## 2018-10-27 ENCOUNTER — Ambulatory Visit: Payer: BLUE CROSS/BLUE SHIELD | Admitting: Urology

## 2018-10-27 ENCOUNTER — Encounter: Payer: Self-pay | Admitting: Urology

## 2018-10-27 VITALS — BP 114/83 | HR 103 | Ht 63.0 in | Wt 176.0 lb

## 2018-10-27 DIAGNOSIS — R300 Dysuria: Secondary | ICD-10-CM | POA: Diagnosis not present

## 2018-10-27 LAB — URINALYSIS, COMPLETE
BILIRUBIN UA: NEGATIVE
Glucose, UA: NEGATIVE
Ketones, UA: NEGATIVE
Leukocytes, UA: NEGATIVE
Nitrite, UA: NEGATIVE
PH UA: 7 (ref 5.0–7.5)
PROTEIN UA: NEGATIVE
Specific Gravity, UA: 1.01 (ref 1.005–1.030)
Urobilinogen, Ur: 0.2 mg/dL (ref 0.2–1.0)

## 2018-10-27 LAB — MICROSCOPIC EXAMINATION: WBC, UA: NONE SEEN /hpf (ref 0–5)

## 2018-10-27 MED ORDER — NITROFURANTOIN MONOHYD MACRO 100 MG PO CAPS: 100.0000 mg | ORAL_CAPSULE | Freq: Two times a day (BID) | ORAL | 0 refills | Status: DC

## 2018-10-27 NOTE — Progress Notes (Signed)
10/27/2018 4:23 PM   Olivia Walter May 16, 1986 [id number]  Referring provider: Arne Cleveland, MD [address] [place], [place] [address]  Chief Complaint  Patient presents with  . Dysuria  . Abdominal Pain    HPI: Patient is a 32 year old Caucasian female who presents today with complaint of pelvic pain, cramping when urinating, nausea, low back pain and dizziness.  She was initially seen in our office for pseudohematuria on March 13, 2017.   She is a former smoker, with a 2 ppd history.  Quit on 11/17/2016.  She is not exposed to secondhand smoke.  She does not work with chemicals.  She has a high BMI.   She is experiencing constipation.   She states her sister and mother are hypochondriacs.    CT Renal stone study performed on 04/01/2017 noted no acute findings in the abdomen or pelvis. Specifically, no findings to explain the patient's history of left flank pain.  She presented on 07/22/2017 as a referral from Dr. Evette Cristal- [facility] for sharp pains, bladder fullness, incomplete emptying and a foul odor to her urine.  CT with contrast completed on 08/02/2017 found no acute findings are noted in the abdomen or pelvis to account for the patient's symptoms.  Today, she is having pelvic pain, cramping with urinating, nausea, low back pain and dizziness.  These symptoms started last week.   Nothing has helped the pain and movement makes the pain worse.  8/10 pain.  It is continuous pain.  Her UA today is positive for moderate bacteria.  She has been having nausea.  Patient denies any gross hematuria, dysuria or suprapubic/flank pain.  Patient denies any fevers, chills or vomiting.   She is currently on Myrbetriq and gabapentin.    She drinks a bottle or two of water, no coffee, drinks almost a twelve pack of Coke daily, no teas, no alcohol, no energy drinks and rarely cranberry juice.    She is not sexually active.  She has not had any vaginal discharge.     PMH: Past Medical History:  Diagnosis Date  . Asthma    childhood asthma  . Bipolar disorder (HCC)   . GERD (gastroesophageal reflux disease)   . Headache    Migraines  . History of kidney stones   . History of stomach ulcers   . Kidney calculus   . Kidney stones   . Osteoarthritis   . Reactive hypoglycemia     Surgical History: Past Surgical History:  Procedure Laterality Date  . CAPSULE ENDOSCOPY    . COLONOSCOPY    . cyst removed on wrist    . CYSTOSCOPY N/A 12/02/2017   Procedure: CYSTOSCOPY;  Surgeon: [name], MD;  Location: [facility] ORS;  Service: Gynecology;  Laterality: N/A;  . ESOPHAGOGASTRODUODENOSCOPY    . ESOPHAGOGASTRODUODENOSCOPY (EGD) WITH PROPOFOL N/A 10/01/2016   Procedure: ESOPHAGOGASTRODUODENOSCOPY (EGD) WITH PROPOFOL;  Surgeon: Christena Deem, MD;  Location: [facility] ENDOSCOPY;  Service: Endoscopy;  Laterality: N/A;  . LAPAROSCOPIC BILATERAL SALPINGECTOMY Bilateral 12/02/2017   Procedure: LAPAROSCOPIC BILATERAL SALPINGECTOMY;  Surgeon: [name], MD;  Location: [facility] ORS;  Service: Gynecology;  Laterality: Bilateral;  . LAPAROSCOPIC HYSTERECTOMY N/A 12/02/2017   Procedure: HYSTERECTOMY TOTAL LAPAROSCOPIC;  Surgeon: [name], MD;  Location: [facility] ORS;  Service: Gynecology;  Laterality: N/A;  . MOUTH SURGERY      Home Medications:  Allergies as of 10/27/2018      Reactions   Bee Venom Anaphylaxis   Penicillins Anaphylaxis  Has patient had a PCN reaction causing immediate rash, facial/tongue/throat swelling, SOB or lightheadedness with hypotension: Yes Has patient had a PCN reaction causing severe rash involving mucus membranes or skin necrosis: No Has patient had a PCN reaction that required hospitalization: Unknown Has patient had a PCN reaction occurring within the last 10 years: No If all of the above answers are "NO", then may proceed with Cephalosporin use.   Bactrim [sulfamethoxazole-trimethoprim] Other (See Comments)   migraine    Clindamycin/lincomycin Nausea And Vomiting      Medication List        Accurate as of 10/27/18  4:23 PM. Always use your most recent med list.          aspirin-acetaminophen-caffeine 250-250-65 MG tablet Commonly known as:  EXCEDRIN MIGRAINE Take 2 tablets by mouth every 6 (six) hours as needed for headache.   gabapentin 800 MG tablet Commonly known as:  NEURONTIN Take 1 tablet (800 mg total) by mouth at bedtime for 3 days.   ibuprofen 800 MG tablet Commonly known as:  ADVIL,MOTRIN Take 1 tablet (800 mg total) by mouth every 8 (eight) hours as needed for moderate pain.   MAGNESIUM-POTASSIUM PO Take 1 tablet by mouth daily as needed (migraines).   mirabegron ER 25 MG Tb24 tablet Commonly known as:  MYRBETRIQ Take by mouth.   nitrofurantoin (macrocrystal-monohydrate) 100 MG capsule Commonly known as:  MACROBID Take 1 capsule (100 mg total) by mouth every 12 (twelve) hours.   oxycodone 5 MG capsule Commonly known as:  OXY-IR Take 1 capsule (5 mg total) by mouth every 6 (six) hours as needed for pain.   SUMAtriptan 6 MG/0.5ML Soln injection Commonly known as:  IMITREX Inject 6 mg into the skin every 2 (two) hours as needed for migraine or headache. May repeat in 2 hours if headache persists or recurs.       Allergies:  Allergies  Allergen Reactions  . Bee Venom Anaphylaxis  . Penicillins Anaphylaxis    Has patient had a PCN reaction causing immediate rash, facial/tongue/throat swelling, SOB or lightheadedness with hypotension: Yes Has patient had a PCN reaction causing severe rash involving mucus membranes or skin necrosis: No Has patient had a PCN reaction that required hospitalization: Unknown Has patient had a PCN reaction occurring within the last 10 years: No If all of the above answers are "NO", then may proceed with Cephalosporin use.   . Bactrim [Sulfamethoxazole-Trimethoprim] Other (See Comments)    migraine  . Clindamycin/Lincomycin Nausea And Vomiting      Family History: Family History  Problem Relation Age of Onset  . Diabetes Mother   . Congestive Heart Failure Mother   . Hypertension Mother   . Hypertension Father   . Prostate cancer Neg Hx   . Kidney cancer Neg Hx   . Bladder Cancer Neg Hx     Social History:  reports that she quit smoking about 2 years ago. Her smoking use included cigarettes. She smoked 1.00 pack per day. She has never used smokeless tobacco. She reports that she does not drink alcohol or use drugs.  ROS: UROLOGY Frequent Urination?: No Hard to postpone urination?: No Burning/pain with urination?: Yes Get up at night to urinate?: No Leakage of urine?: No Urine stream starts and stops?: No Trouble starting stream?: No Do you have to strain to urinate?: No Blood in urine?: No Urinary tract infection?: No Sexually transmitted disease?: No Injury to kidneys or bladder?: No Painful intercourse?: No Weak stream?: No Currently pregnant?:  No Vaginal bleeding?: No Last menstrual period?: n  Gastrointestinal Nausea?: Yes Vomiting?: No Indigestion/heartburn?: No Diarrhea?: No Constipation?: No  Constitutional Fever: No Night sweats?: No Weight loss?: No Fatigue?: Yes  Skin Skin rash/lesions?: No Itching?: No  Eyes Blurred vision?: No Double vision?: No  Ears/Nose/Throat Sore throat?: No Sinus problems?: No  Hematologic/Lymphatic Swollen glands?: No Easy bruising?: No  Cardiovascular Leg swelling?: No Chest pain?: No  Respiratory Cough?: No Shortness of breath?: No  Endocrine Excessive thirst?: No  Musculoskeletal Back pain?: Yes Joint pain?: No  Neurological Headaches?: No Dizziness?: Yes  Psychologic Depression?: No Anxiety?: No  Physical Exam: BP 114/83 (BP Location: Left Arm, Patient Position: Sitting, Cuff Size: Normal)   Pulse (!) 103   Ht 5\' 3"  (1.6 m)   Wt 176 lb (79.8 kg)   BMI 31.18 kg/m   Constitutional:  Well nourished. Alert and oriented, No  acute distress. HEENT: [place] AT, moist mucus membranes.  Trachea midline, no masses. Cardiovascular: No clubbing, cyanosis, or edema. Respiratory: Normal respiratory effort, no increased work of breathing. GI: Abdomen is soft, non tender, non distended, no abdominal masses. Liver and spleen not palpable.  No hernias appreciated.  Stool sample for occult testing is not indicated.   GU: No CVA tenderness.  No bladder fullness or masses.  Atrophic external genitalia, normal pubic hair distribution, no lesions.  Normal urethral meatus, no lesions, no prolapse, no discharge.   No urethral masses, tenderness and/or tenderness. No bladder fullness, tenderness or masses. Normal vagina mucosa, good estrogen effect, no discharge, no lesions, fair pelvic support, grade I cystocele and no rectocele noted.  Cervix and uterus are surgically absent.  Right adnexal mass or tenderness noted.  Anus and perineum are without rashes or lesions.    Skin: No rashes, bruises or suspicious lesions. Lymph: No cervical or inguinal adenopathy. Neurologic: Grossly intact, no focal deficits, moving all 4 extremities. Psychiatric: Normal mood and affect.   Laboratory Data: Urinalysis Moderate bacteria.  See EPIC.   I have reviewed the labs.   Assessment & Plan:    1. Pelvic pain UA with moderate bacteria - will send for culture- will prescribe Macrobid and adjust if necessary once culture results are available RTC pending urine culture results Discussed with patient her excessive fluid intake of soda is not healthy and likely contributing to her symptoms and to increase her water intake  2. UTI Start Macrobid     Return for pending urine culture .  These notes generated with voice recognition software. I apologize for typographical errors.  Michiel Cowboy, PA-C  [facility] [address] [place], [place] [address] [phone]

## 2018-10-31 LAB — CULTURE, URINE COMPREHENSIVE

## 2018-12-09 ENCOUNTER — Other Ambulatory Visit: Payer: Self-pay | Admitting: Obstetrics and Gynecology

## 2018-12-10 ENCOUNTER — Other Ambulatory Visit: Payer: Self-pay | Admitting: Obstetrics and Gynecology

## 2018-12-10 DIAGNOSIS — N6001 Solitary cyst of right breast: Secondary | ICD-10-CM

## 2019-01-06 ENCOUNTER — Ambulatory Visit
Admission: RE | Admit: 2019-01-06 | Discharge: 2019-01-06 | Disposition: A | Discharge status: Home / Self Care | Payer: BLUE CROSS/BLUE SHIELD | Source: Ambulatory Visit | Attending: Obstetrics and Gynecology | Admitting: Obstetrics and Gynecology

## 2019-01-06 DIAGNOSIS — N6001 Solitary cyst of right breast: Secondary | ICD-10-CM | POA: Diagnosis not present

## 2019-06-30 ENCOUNTER — Other Ambulatory Visit: Payer: Self-pay | Admitting: Obstetrics and Gynecology

## 2019-06-30 DIAGNOSIS — N6001 Solitary cyst of right breast: Secondary | ICD-10-CM

## 2019-06-30 DIAGNOSIS — N644 Mastodynia: Secondary | ICD-10-CM

## 2019-07-08 ENCOUNTER — Ambulatory Visit
Admission: RE | Admit: 2019-07-08 | Discharge: 2019-07-08 | Disposition: A | Discharge status: Home / Self Care | Payer: BC Managed Care – PPO | Source: Ambulatory Visit | Attending: Obstetrics and Gynecology | Admitting: Obstetrics and Gynecology

## 2019-07-08 DIAGNOSIS — N644 Mastodynia: Secondary | ICD-10-CM | POA: Insufficient documentation

## 2019-07-08 DIAGNOSIS — N6001 Solitary cyst of right breast: Secondary | ICD-10-CM | POA: Diagnosis not present

## 2019-09-07 ENCOUNTER — Telehealth: Payer: Self-pay | Admitting: *Deleted

## 2019-09-07 NOTE — Telephone Encounter (Signed)
Patient states she did a questionnaire on the [employer] that asked her if she wanted to speak with a nurse or do a virtual visit.  She asked to speak with a nurse.  She states she has been having headaches, congestion, cough, and tightness in her chest with coughing or deep breathing.  She is unsure if she is running a fever.  Advised patient with these symptoms I would suggest she be evaluated at urgent care due to the chest tightness with coughing.  Patient states she called urgent care and they are not taking more patients today for COVID symptoms.  Advised her if her symptoms worsen- if she develops shortness of breath or constant chest pain she should go to ED for evaluation this evening.  Advised patient she can take tylenol or ibuprofen for headache, over the counter cough medication for cough, stay well hydrated and rest.  Recommended she get evaluated at urgent care tomorrow, or ED tonight if symptoms worsen.  Patient expressed understanding and is agreeable.

## 2019-10-01 ENCOUNTER — Other Ambulatory Visit: Payer: Self-pay

## 2019-10-01 ENCOUNTER — Emergency Department: Payer: BC Managed Care – PPO

## 2019-10-01 ENCOUNTER — Encounter: Payer: Self-pay | Admitting: Radiology

## 2019-10-01 ENCOUNTER — Emergency Department
Admission: EM | Admit: 2019-10-01 | Discharge: 2019-10-01 | Disposition: A | Discharge status: Home / Self Care | Payer: BC Managed Care – PPO | Attending: Emergency Medicine | Admitting: Emergency Medicine

## 2019-10-01 DIAGNOSIS — Z87891 Personal history of nicotine dependence: Secondary | ICD-10-CM | POA: Insufficient documentation

## 2019-10-01 DIAGNOSIS — E876 Hypokalemia: Secondary | ICD-10-CM | POA: Insufficient documentation

## 2019-10-01 DIAGNOSIS — Z79899 Other long term (current) drug therapy: Secondary | ICD-10-CM | POA: Insufficient documentation

## 2019-10-01 DIAGNOSIS — J45909 Unspecified asthma, uncomplicated: Secondary | ICD-10-CM | POA: Insufficient documentation

## 2019-10-01 DIAGNOSIS — N83201 Unspecified ovarian cyst, right side: Secondary | ICD-10-CM | POA: Insufficient documentation

## 2019-10-01 DIAGNOSIS — R109 Unspecified abdominal pain: Secondary | ICD-10-CM | POA: Insufficient documentation

## 2019-10-01 LAB — COMPREHENSIVE METABOLIC PANEL
ALT: 23 U/L (ref 0–44)
AST: 33 U/L (ref 15–41)
Albumin: 4.2 g/dL (ref 3.5–5.0)
Alkaline Phosphatase: 63 U/L (ref 38–126)
Anion gap: 10 (ref 5–15)
BUN: 6 mg/dL (ref 6–20)
CO2: 25 mmol/L (ref 22–32)
Calcium: 8.8 mg/dL — ABNORMAL LOW (ref 8.9–10.3)
Chloride: 102 mmol/L (ref 98–111)
Creatinine, Ser: 0.65 mg/dL (ref 0.44–1.00)
GFR calc Af Amer: >60 mL/min (ref >60)
GFR calc non Af Amer: >60 mL/min (ref >60)
Glucose, Bld: 111 mg/dL — ABNORMAL HIGH (ref 70–99)
Potassium: 2.9 mmol/L — ABNORMAL LOW (ref 3.5–5.1)
Sodium: 137 mmol/L (ref 135–145)
Total Bilirubin: 0.7 mg/dL (ref 0.3–1.2)
Total Protein: 7.1 g/dL (ref 6.5–8.1)

## 2019-10-01 LAB — URINALYSIS, COMPLETE (UACMP) WITH MICROSCOPIC
Bilirubin Urine: NEGATIVE
Glucose, UA: NEGATIVE mg/dL
Hgb urine dipstick: NEGATIVE
Ketones, ur: NEGATIVE mg/dL
Leukocytes,Ua: NEGATIVE
Nitrite: NEGATIVE
Protein, ur: NEGATIVE mg/dL
Specific Gravity, Urine: 1.01 (ref 1.005–1.030)
pH: 6 (ref 5.0–8.0)

## 2019-10-01 LAB — CBC
HCT: 39 % (ref 36.0–46.0)
Hemoglobin: 13.7 g/dL (ref 12.0–15.0)
MCH: 32.1 pg (ref 26.0–34.0)
MCHC: 35.1 g/dL (ref 30.0–36.0)
MCV: 91.3 fL (ref 80.0–100.0)
Platelets: 352 10*3/uL (ref 150–400)
RBC: 4.27 MIL/uL (ref 3.87–5.11)
RDW: 13.2 % (ref 11.5–15.5)
WBC: 10.6 10*3/uL — ABNORMAL HIGH (ref 4.0–10.5)
nRBC: 0 % (ref 0.0–0.2)

## 2019-10-01 LAB — TROPONIN I (HIGH SENSITIVITY): Troponin I (High Sensitivity): 2 ng/L (ref <18)

## 2019-10-01 LAB — LIPASE, BLOOD: Lipase: 22 U/L (ref 11–51)

## 2019-10-01 MED ORDER — FENTANYL CITRATE (PF) 100 MCG/2ML IJ SOLN
50.0000 ug | Freq: Once | INTRAMUSCULAR | Status: AC
  Administered 2019-10-01: 50 ug via INTRAVENOUS
  Filled 2019-10-01: qty 2

## 2019-10-01 MED ORDER — POTASSIUM CHLORIDE 10 MEQ/100ML IV SOLN
10.0000 meq | Freq: Once | INTRAVENOUS | Status: DC
  Filled 2019-10-01: qty 100

## 2019-10-01 MED ORDER — IOHEXOL 9 MG/ML PO SOLN
500.0000 mL | Freq: Once | ORAL | Status: DC | PRN
  Administered 2019-10-01: 07:00:00 1000 mL via ORAL
  Filled 2019-10-01: qty 500

## 2019-10-01 MED ORDER — NAPROXEN 500 MG PO TABS: 500.0000 mg | ORAL_TABLET | Freq: Two times a day (BID) | ORAL | 2 refills | Status: AC

## 2019-10-01 MED ORDER — TRAMADOL HCL 50 MG PO TABS: 50.0000 mg | ORAL_TABLET | Freq: Four times a day (QID) | ORAL | 0 refills | Status: AC | PRN

## 2019-10-01 MED ORDER — SODIUM CHLORIDE 0.9 % IV BOLUS
1000.0000 mL | Freq: Once | INTRAVENOUS | Status: AC
  Administered 2019-10-01: 07:00:00 1000 mL via INTRAVENOUS

## 2019-10-01 MED ORDER — ONDANSETRON HCL 4 MG/2ML IJ SOLN
4.0000 mg | Freq: Once | INTRAMUSCULAR | Status: AC
  Administered 2019-10-01: 07:00:00 4 mg via INTRAVENOUS
  Filled 2019-10-01: qty 2

## 2019-10-01 MED ORDER — KETOROLAC TROMETHAMINE 30 MG/ML IJ SOLN
30.0000 mg | Freq: Once | INTRAMUSCULAR | Status: AC
  Administered 2019-10-01: 08:00:00 30 mg via INTRAVENOUS
  Filled 2019-10-01: qty 1

## 2019-10-01 MED ORDER — IOHEXOL 300 MG/ML  SOLN
100.0000 mL | Freq: Once | INTRAMUSCULAR | Status: AC | PRN
  Administered 2019-10-01: 100 mL via INTRAVENOUS

## 2019-10-01 MED ORDER — POTASSIUM CHLORIDE 10 MEQ/100ML IV SOLN
10.0000 meq | Freq: Once | INTRAVENOUS | Status: AC
  Administered 2019-10-01: 08:00:00 10 meq via INTRAVENOUS
  Filled 2019-10-01: qty 100

## 2019-10-01 NOTE — ED Notes (Signed)
Patient finished with 1.5 bottles of contrast. States driniking is making her pain worse. Patient ambulatory to use bedside toilet.

## 2019-10-01 NOTE — ED Triage Notes (Signed)
Pt in with co right sided abd pain that radiates into right lower back. Tonight started radiating to right chest, no hx of the same. Hx of kidney stones and ulcers but states feels different.

## 2019-10-01 NOTE — ED Notes (Signed)
Pt in with co right sided abd pain that radiates to right lower back states x few days. Today it moved up to right chest and shoulder. Denies any hx of the same, no fever. Denies any n.v.d but states does have some cramping when she voids.

## 2019-10-01 NOTE — ED Notes (Signed)
EDP in to eval 

## 2019-10-01 NOTE — ED Provider Notes (Signed)
[facility] Emergency Department Provider Note   ____________________________________________   First MD Initiated Contact with Patient 10/01/19 [phone]     (approximate)  I have reviewed the triage vital signs and the nursing notes.   HISTORY  Chief Complaint Abdominal Pain    HPI Olivia Walter is a 33 y.o. female who presents to the ED from home with a chief complaint of abdominal pain.  Patient reports right mid lateral abdominal pain which radiates into her right lower back and chest.  History of kidney stones and peptic ulcer disease but states this feels differently.  Onset times last night.  Denies associated fever, cough, shortness of breath, nausea, vomiting, dysuria, diarrhea.       Past Medical History:  Diagnosis Date   Asthma    childhood asthma   Bipolar disorder (HCC)    GERD (gastroesophageal reflux disease)    Headache    Migraines   History of kidney stones    History of stomach ulcers    Kidney calculus    Kidney stones    Osteoarthritis    Reactive hypoglycemia     There are no active problems to display for this patient.   Past Surgical History:  Procedure Laterality Date   CAPSULE ENDOSCOPY     COLONOSCOPY     cyst removed on wrist     CYSTOSCOPY N/A 12/02/2017   Procedure: CYSTOSCOPY;  Surgeon: [name], MD;  Location: [facility] ORS;  Service: Gynecology;  Laterality: N/A;   ESOPHAGOGASTRODUODENOSCOPY     ESOPHAGOGASTRODUODENOSCOPY (EGD) WITH PROPOFOL N/A 10/01/2016   Procedure: ESOPHAGOGASTRODUODENOSCOPY (EGD) WITH PROPOFOL;  Surgeon: Christena Deem, MD;  Location: [facility] ENDOSCOPY;  Service: Endoscopy;  Laterality: N/A;   LAPAROSCOPIC BILATERAL SALPINGECTOMY Bilateral 12/02/2017   Procedure: LAPAROSCOPIC BILATERAL SALPINGECTOMY;  Surgeon: [name], MD;  Location: [facility] ORS;  Service: Gynecology;  Laterality: Bilateral;   LAPAROSCOPIC HYSTERECTOMY N/A 12/02/2017   Procedure: HYSTERECTOMY  TOTAL LAPAROSCOPIC;  Surgeon: [name], MD;  Location: [facility] ORS;  Service: Gynecology;  Laterality: N/A;   MOUTH SURGERY      Prior to Admission medications   Medication Sig Start Date End Date Taking? Authorizing Provider  aspirin-acetaminophen-caffeine (EXCEDRIN MIGRAINE) [phone] MG tablet Take 2 tablets by mouth every 6 (six) hours as needed for headache.     [provider]  gabapentin (NEURONTIN) 800 MG tablet Take 1 tablet (800 mg total) by mouth at bedtime for 3 days. 12/02/17 10/27/18  [name], MD  ibuprofen (ADVIL,MOTRIN) 800 MG tablet Take 1 tablet (800 mg total) by mouth every 8 (eight) hours as needed for moderate pain. Patient not taking: Reported on 10/27/2018 12/02/17   [name], MD  MAGNESIUM-POTASSIUM PO Take 1 tablet by mouth daily as needed (migraines).     [provider]  mirabegron ER (MYRBETRIQ) 25 MG TB24 tablet Take by mouth. 02/05/18   [provider]  nitrofurantoin, macrocrystal-monohydrate, (MACROBID) 100 MG capsule Take 1 capsule (100 mg total) by mouth every 12 (twelve) hours. 10/27/18   Michiel Cowboy A, PA-C  oxycodone (OXY-IR) 5 MG capsule Take 1 capsule (5 mg total) by mouth every 6 (six) hours as needed for pain. Patient not taking: Reported on 10/27/2018 12/02/17   [name], MD  SUMAtriptan [facility]) 6 MG/0.5ML SOLN injection Inject 6 mg into the skin every 2 (two) hours as needed for migraine or headache. May repeat in 2 hours if headache persists or recurs.    [provider]  Allergies Bee venom, Penicillins, Bactrim [sulfamethoxazole-trimethoprim], and Clindamycin/lincomycin  Family History  Problem Relation Age of Onset   Diabetes Mother    Congestive Heart Failure Mother    Hypertension Mother    Hypertension Father    Prostate cancer Neg Hx    Kidney cancer Neg Hx    Bladder Cancer Neg Hx     Social History Social History   Tobacco Use   Smoking status: Former  Smoker    Packs/day: 1.00    Types: Cigarettes    Quit date: 10/2016    Years since quitting: 2.9   Smokeless tobacco: Never Used   Tobacco comment: Quit in Dec 2017  Substance Use Topics   Alcohol use: No   Drug use: No    Review of Systems  Constitutional: No fever/chills Eyes: No visual changes. ENT: No sore throat. Cardiovascular: Denies chest pain. Respiratory: Denies shortness of breath. Gastrointestinal: Positive for abdominal pain.  No nausea, no vomiting.  No diarrhea.  No constipation. Genitourinary: Negative for dysuria. Musculoskeletal: Negative for back pain. Skin: Negative for rash. Neurological: Negative for headaches, focal weakness or numbness.   ____________________________________________   PHYSICAL EXAM:  VITAL SIGNS: ED Triage Vitals  Enc Vitals Group     BP 10/01/19 0241 112/81     Pulse Rate 10/01/19 0241 90     Resp 10/01/19 0241 20     Temp 10/01/19 0241 98 F (36.7 C)     Temp src --      SpO2 10/01/19 0241 98 %     Weight 10/01/19 0242 155 lb (70.3 kg)     Height 10/01/19 0242 5\' 3"  (1.6 m)     Head Circumference --      Peak Flow --      Pain Score 10/01/19 0241 10     Pain Loc --      Pain Edu? --      Excl. in GC? --     Constitutional: Alert and oriented. Well appearing and in mild acute distress. Eyes: Conjunctivae are normal. PERRL. EOMI. Head: Atraumatic. Nose: No congestion/rhinnorhea. Mouth/Throat: Mucous membranes are moist.  Oropharynx non-erythematous. Neck: No stridor.   Cardiovascular: Normal rate, regular rhythm. Grossly normal heart sounds.  Good peripheral circulation. Respiratory: Normal respiratory effort.  No retractions. Lungs CTAB. Gastrointestinal: Soft and mildly tender to palpation right upper quadrant, right lateral and right lower quadrant without rebound or guarding. No distention. No abdominal bruits. No CVA tenderness. Musculoskeletal: No lower extremity tenderness nor edema.  No joint  effusions. Neurologic:  Normal speech and language. No gross focal neurologic deficits are appreciated. No gait instability. Skin:  Skin is warm, dry and intact. No rash noted. Psychiatric: Mood and affect are normal. Speech and behavior are normal.  ____________________________________________   LABS (all labs ordered are listed, but only abnormal results are displayed)  Labs Reviewed  CBC - Abnormal; Notable for the following components:      Result Value   WBC 10.6 (*)    All other components within normal limits  COMPREHENSIVE METABOLIC PANEL - Abnormal; Notable for the following components:   Potassium 2.9 (*)    Glucose, Bld 111 (*)    Calcium 8.8 (*)    All other components within normal limits  URINALYSIS, COMPLETE (UACMP) WITH MICROSCOPIC - Abnormal; Notable for the following components:   Color, Urine YELLOW (*)    APPearance CLEAR (*)    Bacteria, UA RARE (*)    All other components within normal  limits  LIPASE, BLOOD  TROPONIN I (HIGH SENSITIVITY)   ____________________________________________  EKG  ED ECG REPORT I, Camari Wisham J, the attending physician, personally viewed and interpreted this ECG.   Date: 10/01/2019  EKG Time: 0253  Rate: 81  Rhythm: normal EKG, normal sinus rhythm  Axis: Normal  Intervals:none  ST&T Change: Nonspecific  ____________________________________________  RADIOLOGY  ED MD interpretation: Pending  Official radiology report(s): No results found.  ____________________________________________   PROCEDURES  Procedure(s) performed (including Critical Care):  Procedures   ____________________________________________   INITIAL IMPRESSION / ASSESSMENT AND PLAN / ED COURSE  As part of my medical decision making, I reviewed the following data within the [place] notes reviewed and incorporated, Labs reviewed, EKG interpreted, Old chart reviewed and Notes from prior ED visits     Olivia Walter was evaluated in Emergency Department on 10/01/2019 for the symptoms described in the history of present illness. She was evaluated in the context of the global COVID-19 pandemic, which necessitated consideration that the patient might be at risk for infection with the SARS-CoV-2 virus that causes COVID-19. Institutional protocols and algorithms that pertain to the evaluation of patients at risk for COVID-19 are in a state of rapid change based on information released by regulatory bodies including the CDC and federal and state organizations. These policies and algorithms were followed during the patient's care in the ED.    33 year old female who presents with right-sided abdominal pain. Differential diagnosis includes, but is not limited to, ovarian cyst, ovarian torsion, acute appendicitis, diverticulitis, urinary tract infection/pyelonephritis, endometriosis, bowel obstruction, colitis, renal colic, gastroenteritis, hernia, fibroids, endometriosis, pregnancy related pain including ectopic pregnancy, etc.  Patient has had hysterectomy.  She is more tender in her right lower quadrant than she is anywhere else.  Will administer IV analgesia, fluids and proceed with CT abdomen/pelvis.   Clinical Course as of Sep 30 658  Thu Oct 01, 2019  0658 Care transferred to Dr. Corky Downs at change of shift pending CT scan and disposition.   [JS]    Clinical Course User Index [JS] Paulette Blanch, MD     ____________________________________________   FINAL CLINICAL IMPRESSION(S) / ED DIAGNOSES  Final diagnoses:  Abdominal pain, unspecified abdominal location  Hypokalemia     ED Discharge Orders    None       Note:  This document was prepared using Dragon voice recognition software and may include unintentional dictation errors.   Paulette Blanch, MD 10/01/19 [phone]

## 2019-10-01 NOTE — ED Notes (Signed)
Per EDP, Kinner, patient only to receive one dose 64mEq IV Potassium.

## 2019-10-01 NOTE — ED Provider Notes (Signed)
CT scan demonstrates cyst with likely rupture, this is likely the cause of her pain.  Discussed CT results with her, she will follow-up with Dr. Leafy Ro her gynecologist.  Given IV Toradol to help with inflammation.   Lavonia Drafts, MD 10/01/19 [phone]

## 2020-04-28 ENCOUNTER — Other Ambulatory Visit: Payer: Self-pay

## 2020-04-28 ENCOUNTER — Emergency Department
Admission: EM | Admit: 2020-04-28 | Discharge: 2020-04-28 | Disposition: A | Discharge status: Home / Self Care | Payer: BC Managed Care – PPO | Attending: Emergency Medicine | Admitting: Emergency Medicine

## 2020-04-28 ENCOUNTER — Encounter: Payer: Self-pay | Admitting: Emergency Medicine

## 2020-04-28 DIAGNOSIS — R52 Pain, unspecified: Secondary | ICD-10-CM

## 2020-04-28 DIAGNOSIS — M5093 Cervical disc disorder, unspecified, cervicothoracic region: Secondary | ICD-10-CM | POA: Insufficient documentation

## 2020-04-28 DIAGNOSIS — M79602 Pain in left arm: Secondary | ICD-10-CM

## 2020-04-28 DIAGNOSIS — M47892 Other spondylosis, cervical region: Secondary | ICD-10-CM | POA: Diagnosis not present

## 2020-04-28 DIAGNOSIS — Z52008 Unspecified donor, other blood: Secondary | ICD-10-CM | POA: Insufficient documentation

## 2020-04-28 MED ORDER — CYCLOBENZAPRINE HCL 5 MG PO TABS: 5.0000 mg | ORAL_TABLET | Freq: Three times a day (TID) | ORAL | 0 refills | Status: AC | PRN

## 2020-04-28 MED ORDER — CYCLOBENZAPRINE HCL 10 MG PO TABS
10.0000 mg | ORAL_TABLET | Freq: Once | ORAL | Status: AC
  Administered 2020-04-28: 10 mg via ORAL
  Filled 2020-04-28: qty 1

## 2020-04-28 MED ORDER — MELOXICAM 15 MG PO TABS: 15.0000 mg | ORAL_TABLET | Freq: Every day | ORAL | 0 refills | Status: AC

## 2020-04-28 MED ORDER — MELOXICAM 7.5 MG PO TABS
15.0000 mg | ORAL_TABLET | Freq: Once | ORAL | Status: AC
  Administered 2020-04-28: 15 mg via ORAL
  Filled 2020-04-28: qty 2

## 2020-04-28 NOTE — ED Triage Notes (Signed)
Patient ambulatory to triage with steady gait, without difficulty or distress noted, mask in place; pt reports that she donated plasma today from left a/c; since has had pain to left arm, increases with movement; no swelling noted; pt st "I think they hit a nerve, it feels like it does when you hit your funny bone"

## 2020-04-28 NOTE — Discharge Instructions (Addendum)
Please rest ice and elevate the left upper extremity.  Apply ice to the injection site.  Take medications as prescribed.  If any increasing pain swelling warmth redness numbness or tingling return to the ER.

## 2020-04-28 NOTE — ED Provider Notes (Signed)
[facility] EMERGENCY DEPARTMENT Provider Note   CSN: [id number] Arrival date & time: 04/28/20  1913     History Chief Complaint  Patient presents with  . Arm Pain    Olivia Walter is a 34 y.o. female.  Presents to the emergency department for evaluation of left arm pain.  She points to the left antecubital fossa.  States that she was donating plasma today when she noticed pain in the forearm during the time of the donation.  She states that the phlebotomist rotated the needle and she saw some improvement.  She describes pain tightness numbness in the ulnar nerve distribution of the left forearm and into the fourth and fifth digit as well as some pain in the tricep region of the left shoulder.  She has not had a medications for pain.  No warmth redness or fevers.  No loss of range of motion of the upper extremities.  She states she does have a history of cervical disc issues with arthritis. HPI     Past Medical History:  Diagnosis Date  . Asthma    childhood asthma  . Bipolar disorder (HCC)   . GERD (gastroesophageal reflux disease)   . Headache    Migraines  . History of kidney stones   . History of stomach ulcers   . Kidney calculus   . Kidney stones   . Osteoarthritis   . Reactive hypoglycemia     There are no problems to display for this patient.   Past Surgical History:  Procedure Laterality Date  . CAPSULE ENDOSCOPY    . COLONOSCOPY    . cyst removed on wrist    . CYSTOSCOPY N/A 12/02/2017   Procedure: CYSTOSCOPY;  Surgeon: [name], MD;  Location: [facility] ORS;  Service: Gynecology;  Laterality: N/A;  . ESOPHAGOGASTRODUODENOSCOPY    . ESOPHAGOGASTRODUODENOSCOPY (EGD) WITH PROPOFOL N/A 10/01/2016   Procedure: ESOPHAGOGASTRODUODENOSCOPY (EGD) WITH PROPOFOL;  Surgeon: Christena Deem, MD;  Location: [facility] ENDOSCOPY;  Service: Endoscopy;  Laterality: N/A;  . LAPAROSCOPIC BILATERAL SALPINGECTOMY Bilateral 12/02/2017   Procedure:  LAPAROSCOPIC BILATERAL SALPINGECTOMY;  Surgeon: [name], MD;  Location: [facility] ORS;  Service: Gynecology;  Laterality: Bilateral;  . LAPAROSCOPIC HYSTERECTOMY N/A 12/02/2017   Procedure: HYSTERECTOMY TOTAL LAPAROSCOPIC;  Surgeon: [name], MD;  Location: [facility] ORS;  Service: Gynecology;  Laterality: N/A;  . MOUTH SURGERY       OB History   No obstetric history on file.     Family History  Problem Relation Age of Onset  . Diabetes Mother   . Congestive Heart Failure Mother   . Hypertension Mother   . Hypertension Father   . Prostate cancer Neg Hx   . Kidney cancer Neg Hx   . Bladder Cancer Neg Hx     Social History   Tobacco Use  . Smoking status: Former Smoker    Packs/day: 1.00    Types: Cigarettes    Quit date: 10/2016    Years since quitting: 3.5  . Smokeless tobacco: Never Used  . Tobacco comment: Quit in Dec 2017  Substance Use Topics  . Alcohol use: No  . Drug use: No    Home Medications Prior to Admission medications   Medication Sig Start Date End Date Taking? Authorizing Provider  aspirin-acetaminophen-caffeine (EXCEDRIN MIGRAINE) [phone] MG tablet Take 2 tablets by mouth every 6 (six) hours as needed for headache.     [provider]  cyclobenzaprine (FLEXERIL) 5 MG tablet Take 1-2 tablets (5-10  mg total) by mouth 3 (three) times daily as needed for muscle spasms. 04/28/20   Evon Slack, PA-C  gabapentin (NEURONTIN) 800 MG tablet Take 1 tablet (800 mg total) by mouth at bedtime for 3 days. 12/02/17 10/27/18  [name], MD  ibuprofen (ADVIL,MOTRIN) 800 MG tablet Take 1 tablet (800 mg total) by mouth every 8 (eight) hours as needed for moderate pain. Patient not taking: Reported on 10/27/2018 12/02/17   [name], MD  MAGNESIUM-POTASSIUM PO Take 1 tablet by mouth daily as needed (migraines).     [provider]  meloxicam (MOBIC) 15 MG tablet Take 1 tablet (15 mg total) by mouth daily. 04/28/20 04/28/21  Evon Slack,  PA-C  mirabegron ER (MYRBETRIQ) 25 MG TB24 tablet Take by mouth. 02/05/18   [provider]  naproxen (NAPROSYN) 500 MG tablet Take 1 tablet (500 mg total) by mouth 2 (two) times daily with a meal. 10/01/19   Jene Every, MD  nitrofurantoin, macrocrystal-monohydrate, (MACROBID) 100 MG capsule Take 1 capsule (100 mg total) by mouth every 12 (twelve) hours. 10/27/18   Michiel Cowboy A, PA-C  oxycodone (OXY-IR) 5 MG capsule Take 1 capsule (5 mg total) by mouth every 6 (six) hours as needed for pain. Patient not taking: Reported on 10/27/2018 12/02/17   [name], MD  SUMAtriptan [facility]) 6 MG/0.5ML SOLN injection Inject 6 mg into the skin every 2 (two) hours as needed for migraine or headache. May repeat in 2 hours if headache persists or recurs.    [provider]  traMADol (ULTRAM) 50 MG tablet Take 1 tablet (50 mg total) by mouth every 6 (six) hours as needed. 10/01/19 09/30/20  Jene Every, MD    Allergies    Bee venom, Penicillins, Bactrim [sulfamethoxazole-trimethoprim], and Clindamycin/lincomycin  Review of Systems   Review of Systems  Constitutional: Negative for fever.  Gastrointestinal: Negative for nausea and vomiting.  Musculoskeletal: Positive for myalgias and neck pain. Negative for gait problem and joint swelling.  Skin: Negative for rash and wound.  Neurological: Positive for numbness. Negative for weakness.    Physical Exam Updated Vital Signs BP 108/69 (BP Location: Right Arm)   Pulse 86   Temp 98.3 F (36.8 C) (Oral)   Resp 18   Ht 5\' 3"  (1.6 m)   Wt 64.9 kg   SpO2 98%   BMI 25.33 kg/m   Physical Exam Constitutional:      Appearance: She is well-developed.  HENT:     Head: Normocephalic and atraumatic.  Eyes:     Conjunctiva/sclera: Conjunctivae normal.  Cardiovascular:     Rate and Rhythm: Normal rate.  Pulmonary:     Effort: Pulmonary effort is normal. No respiratory distress.  Musculoskeletal:     Cervical back: Normal range  of motion.     Comments: Left upper extremity with no swelling warmth erythema.  Puncture site at the [facility] fossa visualized with no surrounding swelling warmth erythema or ecchymosis.  Patient's tissues are soft.  Compartments throughout the left upper extremity are soft.  Patient has normal grip strength, biceps triceps and supraspinatus strength.  No limitations in active wrist range of motion.  Sensation is intact throughout.  She does have normal range of motion of cervical spine slightly positive Spurling's test reproducing some neck pain and also right-sided radicular symptoms.  Patient has 2+ radial pulse and 2+ cap refill.  Skin:    General: Skin is warm.     Findings: No rash.  Neurological:  General: No focal deficit present.     Mental Status: She is alert and oriented to person, place, and time.  Psychiatric:        Behavior: Behavior normal.        Thought Content: Thought content normal.     ED Results / Procedures / Treatments   Labs (all labs ordered are listed, but only abnormal results are displayed) Labs Reviewed - No data to display  EKG None  Radiology No results found.  Procedures Procedures (including critical care time)  Medications Ordered in ED Medications  cyclobenzaprine (FLEXERIL) tablet 10 mg (10 mg Oral Given 04/28/20 2047)  meloxicam (MOBIC) tablet 15 mg (15 mg Oral Given 04/28/20 2047)    ED Course  I have reviewed the triage vital signs and the nursing notes.  Pertinent labs & imaging results that were available during my care of the patient were reviewed by me and considered in my medical decision making (see chart for details).    MDM Rules/Calculators/A&P                      34 year old female with left arm pain after plasma drawl.  No signs of infection, hematoma, compartment syndrome, neurological deficits.  Its possible this could be coincidental as her pain is in a C7-C8 nerve distribution and she has a history of cervical disc issues  with spondylosis of the cervical spine.  There was no neurological deficits on exam.  Patient placed on meloxicam and Flexeril.  She understands signs and symptoms return to the ER for such as any increasing pain swelling warmth redness or increasing numbness or weakness.  We discussed applying Ace wrap but due to no visible or palpable swelling did not want to create swelling along the digits if Ace wrap was placed around the forearm and elbow. Final Clinical Impression(s) / ED Diagnoses Final diagnoses:  Left arm pain  Pain at injection site, initial encounter    Rx / DC Orders ED Discharge Orders         Ordered    meloxicam (MOBIC) 15 MG tablet  Daily     04/28/20 2048    cyclobenzaprine (FLEXERIL) 5 MG tablet  3 times daily PRN     04/28/20 2048           Renata Caprice 04/28/20 2109    Nance Pear, MD 04/28/20 2139

## 2020-05-01 ENCOUNTER — Emergency Department
Admission: EM | Admit: 2020-05-01 | Discharge: 2020-05-01 | Disposition: A | Discharge status: Home / Self Care | Payer: BC Managed Care – PPO | Attending: Emergency Medicine | Admitting: Emergency Medicine

## 2020-05-01 ENCOUNTER — Other Ambulatory Visit: Payer: Self-pay

## 2020-05-01 DIAGNOSIS — M79602 Pain in left arm: Secondary | ICD-10-CM | POA: Diagnosis not present

## 2020-05-01 DIAGNOSIS — Z87891 Personal history of nicotine dependence: Secondary | ICD-10-CM | POA: Diagnosis not present

## 2020-05-01 MED ORDER — DEXAMETHASONE SODIUM PHOSPHATE 10 MG/ML IJ SOLN
10.0000 mg | Freq: Once | INTRAMUSCULAR | Status: AC
  Administered 2020-05-01: 10 mg via INTRAMUSCULAR
  Filled 2020-05-01: qty 1

## 2020-05-01 MED ORDER — PREDNISONE 50 MG PO TABS: 50.0000 mg | ORAL_TABLET | Freq: Every day | ORAL | 0 refills | Status: DC

## 2020-05-01 MED ORDER — KETOROLAC TROMETHAMINE 30 MG/ML IJ SOLN
30.0000 mg | Freq: Once | INTRAMUSCULAR | Status: AC
  Administered 2020-05-01: 30 mg via INTRAMUSCULAR
  Filled 2020-05-01: qty 1

## 2020-05-01 NOTE — ED Provider Notes (Signed)
[facility] Emergency Department Provider Note  ____________________________________________  Time seen: Approximately 7:52 PM  I have reviewed the triage vital signs and the nursing notes.   HISTORY  Chief Complaint Arm Pain    HPI Olivia Walter is a 34 y.o. female who presents the emergency department for evaluation of ongoing left arm pain.  Patient donated plasma on Thursday, states that when they inserted the IV she had a sharp sudden shooting sensation down her arm.  She states that she informed the tech that she had pain and they tried to adjust the needle but she continued to have symptoms.  She has had some intermittent numbness and tingling as well as a pins and needle sensation to her left hand.  No erythema, edema.  She does have a history of cervical radiculopathy but states that she has had no symptoms above the height of the elbow.  She is taking meloxicam and Flexeril currently.         Past Medical History:  Diagnosis Date  . Asthma    childhood asthma  . Bipolar disorder (HCC)   . GERD (gastroesophageal reflux disease)   . Headache    Migraines  . History of kidney stones   . History of stomach ulcers   . Kidney calculus   . Kidney stones   . Osteoarthritis   . Reactive hypoglycemia     There are no problems to display for this patient.   Past Surgical History:  Procedure Laterality Date  . CAPSULE ENDOSCOPY    . COLONOSCOPY    . cyst removed on wrist    . CYSTOSCOPY N/A 12/02/2017   Procedure: CYSTOSCOPY;  Surgeon: [name], MD;  Location: [facility] ORS;  Service: Gynecology;  Laterality: N/A;  . ESOPHAGOGASTRODUODENOSCOPY    . ESOPHAGOGASTRODUODENOSCOPY (EGD) WITH PROPOFOL N/A 10/01/2016   Procedure: ESOPHAGOGASTRODUODENOSCOPY (EGD) WITH PROPOFOL;  Surgeon: Christena Deem, MD;  Location: [facility] ENDOSCOPY;  Service: Endoscopy;  Laterality: N/A;  . LAPAROSCOPIC BILATERAL SALPINGECTOMY Bilateral 12/02/2017   Procedure:  LAPAROSCOPIC BILATERAL SALPINGECTOMY;  Surgeon: [name], MD;  Location: [facility] ORS;  Service: Gynecology;  Laterality: Bilateral;  . LAPAROSCOPIC HYSTERECTOMY N/A 12/02/2017   Procedure: HYSTERECTOMY TOTAL LAPAROSCOPIC;  Surgeon: [name], MD;  Location: [facility] ORS;  Service: Gynecology;  Laterality: N/A;  . MOUTH SURGERY      Prior to Admission medications   Medication Sig Start Date End Date Taking? Authorizing Provider  aspirin-acetaminophen-caffeine (EXCEDRIN MIGRAINE) [phone] MG tablet Take 2 tablets by mouth every 6 (six) hours as needed for headache.     [provider]  cyclobenzaprine (FLEXERIL) 5 MG tablet Take 1-2 tablets (5-10 mg total) by mouth 3 (three) times daily as needed for muscle spasms. 04/28/20   Evon Slack, PA-C  gabapentin (NEURONTIN) 800 MG tablet Take 1 tablet (800 mg total) by mouth at bedtime for 3 days. 12/02/17 10/27/18  [name], MD  ibuprofen (ADVIL,MOTRIN) 800 MG tablet Take 1 tablet (800 mg total) by mouth every 8 (eight) hours as needed for moderate pain. Patient not taking: Reported on 10/27/2018 12/02/17   [name], MD  MAGNESIUM-POTASSIUM PO Take 1 tablet by mouth daily as needed (migraines).     [provider]  meloxicam (MOBIC) 15 MG tablet Take 1 tablet (15 mg total) by mouth daily. 04/28/20 04/28/21  Evon Slack, PA-C  mirabegron ER (MYRBETRIQ) 25 MG TB24 tablet Take by mouth. 02/05/18   [provider]  naproxen (NAPROSYN) 500 MG tablet  Take 1 tablet (500 mg total) by mouth 2 (two) times daily with a meal. 10/01/19   Lavonia Drafts, MD  nitrofurantoin, macrocrystal-monohydrate, (MACROBID) 100 MG capsule Take 1 capsule (100 mg total) by mouth every 12 (twelve) hours. 10/27/18   Zara Council A, PA-C  oxycodone (OXY-IR) 5 MG capsule Take 1 capsule (5 mg total) by mouth every 6 (six) hours as needed for pain. Patient not taking: Reported on 10/27/2018 12/02/17   Benjaman Kindler, MD  predniSONE  (DELTASONE) 50 MG tablet Take 1 tablet (50 mg total) by mouth daily with breakfast. 05/01/20   Dale Strausser, Charline Bills, PA-C  SUMAtriptan (IMITREX) 6 MG/0.5ML SOLN injection Inject 6 mg into the skin every 2 (two) hours as needed for migraine or headache. May repeat in 2 hours if headache persists or recurs.    [provider]  traMADol (ULTRAM) 50 MG tablet Take 1 tablet (50 mg total) by mouth every 6 (six) hours as needed. 10/01/19 09/30/20  Lavonia Drafts, MD    Allergies Bee venom, Penicillins, Bactrim [sulfamethoxazole-trimethoprim], and Clindamycin/lincomycin  Family History  Problem Relation Age of Onset  . Diabetes Mother   . Congestive Heart Failure Mother   . Hypertension Mother   . Hypertension Father   . Prostate cancer Neg Hx   . Kidney cancer Neg Hx   . Bladder Cancer Neg Hx     Social History Social History   Tobacco Use  . Smoking status: Former Smoker    Packs/day: 1.00    Types: Cigarettes    Quit date: 10/2016    Years since quitting: 3.5  . Smokeless tobacco: Never Used  . Tobacco comment: Quit in Dec 2017  Substance Use Topics  . Alcohol use: No  . Drug use: No     Review of Systems  Constitutional: No fever/chills Eyes: No visual changes. No discharge ENT: No upper respiratory complaints. Cardiovascular: no chest pain. Respiratory: no cough. No SOB. Gastrointestinal: No abdominal pain.  No nausea, no vomiting.  No diarrhea.  No constipation. Genitourinary: Negative for dysuria. No hematuria Musculoskeletal: Numbness and tingling on the left upper extremity from the elbow down.  This occurred after donating plasma. Skin: Negative for rash, abrasions, lacerations, ecchymosis. Neurological: Negative for headaches, focal weakness or numbness. 10-point ROS otherwise negative.  ____________________________________________   PHYSICAL EXAM:  VITAL SIGNS: ED Triage Vitals  Enc Vitals Group     BP 05/01/20 1809 101/61     Pulse Rate 05/01/20  1808 76     Resp 05/01/20 1808 16     Temp 05/01/20 1808 98.9 F (37.2 C)     Temp Source 05/01/20 1808 Oral     SpO2 05/01/20 1808 98 %     Weight 05/01/20 1808 143 lb (64.9 kg)     Height 05/01/20 1808 5\' 3"  (1.6 m)     Head Circumference --      Peak Flow --      Pain Score 05/01/20 1808 9     Pain Loc --      Pain Edu? --      Excl. in [place]? --      Constitutional: Alert and oriented. Well appearing and in no acute distress. Eyes: Conjunctivae are normal. PERRL. EOMI. Head: Atraumatic. ENT:      Ears:       Nose: No congestion/rhinnorhea.      Mouth/Throat: Mucous membranes are moist.  Neck: No stridor.  No cervical spine tenderness to palpation  Cardiovascular: Normal rate, regular  rhythm. Normal S1 and S2.  Good peripheral circulation. Respiratory: Normal respiratory effort without tachypnea or retractions. Lungs CTAB. Good air entry to the bases with no decreased or absent breath sounds. Musculoskeletal: Full range of motion to all extremities. No gross deformities appreciated.  Visualization of the left upper extremity reveals no erythema, edema.  Good range of motion to all joints.  Patient has mild tenderness to palpation along the right anterior elbow fossa.  This does reproduce the symptoms patient is describing.  Sensation and capillary refill intact distally.  Radial pulse intact. Neurologic:  Normal speech and language. No gross focal neurologic deficits are appreciated.  Skin:  Skin is warm, dry and intact. No rash noted. Psychiatric: Mood and affect are normal. Speech and behavior are normal. Patient exhibits appropriate insight and judgement.   ____________________________________________   LABS (all labs ordered are listed, but only abnormal results are displayed)  Labs Reviewed - No data to display ____________________________________________  EKG   ____________________________________________  RADIOLOGY   No results  found.  ____________________________________________    PROCEDURES  Procedure(s) performed:    Procedures    Medications  ketorolac (TORADOL) 30 MG/ML injection 30 mg (has no administration in time range)  dexamethasone (DECADRON) injection 10 mg (has no administration in time range)     ____________________________________________   INITIAL IMPRESSION / ASSESSMENT AND PLAN / ED COURSE  Pertinent labs & imaging results that were available during my care of the patient were reviewed by me and considered in my medical decision making (see chart for details).  Review of the [place] CSRS was performed in accordance of the [facility] prior to dispensing any controlled drugs.           Patient's diagnosis is consistent with left arm pain.  Patient presented to emergency department for reevaluation of ongoing left arm pain, numbness and tingling following plasma donation 3 days ago.  Patient had pain at the site immediately on insertion of the needle with symptoms running down her left arm.  Patient had been seen, placed on muscle relaxer and anti-inflammatory she also has a history of cervical radiculopathy.  Symptoms have not improved and patient is here for reevaluation.  No evidence for phlebitis, DVT.  No direct trauma to the arm to be concern for underlying skeletal injury.  I feel that this is likely secondary to inflammation from her plasma donation.  Symptoms are reproduced with palpation in this region.  Give the patient Toradol and Decadron injections and place the patient on a short course of steroid in addition to her meloxicam and Flexeril she is currently taking.  If symptoms do not improve follow-up with primary care or orthopedics..  Patient is given ED precautions to return to the ED for any worsening or new symptoms.     ____________________________________________  FINAL CLINICAL IMPRESSION(S) / ED DIAGNOSES  Final diagnoses:  Left arm pain      NEW MEDICATIONS  STARTED DURING THIS VISIT:  ED Discharge Orders         Ordered    predniSONE (DELTASONE) 50 MG tablet  Daily with breakfast     05/01/20 1956              This chart was dictated using voice recognition software/Dragon. Despite best efforts to proofread, errors can occur which can change the meaning. Any change was purely unintentional.    Racheal Patches, PA-C 05/01/20 1958    Arnaldo Natal, MD 05/01/20 [id number]

## 2020-05-01 NOTE — ED Notes (Signed)
Spoke to [place], [place] about pt presentation. No blood work at this time.

## 2020-05-01 NOTE — ED Triage Notes (Signed)
Pt states she donated plasma on Thursday and c/o that "I think they hit a nerve." pt states fingers feel pins and needles. Arrives with arm in sling.   A&O, ambulatory. No distress noted.

## 2020-08-11 ENCOUNTER — Other Ambulatory Visit: Payer: Self-pay | Admitting: Student

## 2020-08-11 ENCOUNTER — Other Ambulatory Visit (HOSPITAL_COMMUNITY): Payer: Self-pay | Admitting: Student

## 2020-08-11 DIAGNOSIS — R1012 Left upper quadrant pain: Secondary | ICD-10-CM

## 2020-08-12 ENCOUNTER — Ambulatory Visit
Admission: RE | Admit: 2020-08-12 | Discharge: 2020-08-12 | Disposition: A | Discharge status: Home / Self Care | Payer: BC Managed Care – PPO | Source: Ambulatory Visit | Attending: Student | Admitting: Student

## 2020-08-12 ENCOUNTER — Other Ambulatory Visit: Payer: Self-pay | Admitting: Student

## 2020-08-12 ENCOUNTER — Other Ambulatory Visit: Payer: Self-pay

## 2020-08-12 DIAGNOSIS — R748 Abnormal levels of other serum enzymes: Secondary | ICD-10-CM | POA: Diagnosis present

## 2020-08-12 DIAGNOSIS — R1012 Left upper quadrant pain: Secondary | ICD-10-CM | POA: Insufficient documentation

## 2020-08-12 MED ORDER — IOHEXOL 300 MG/ML  SOLN
100.0000 mL | Freq: Once | INTRAMUSCULAR | Status: AC | PRN
  Administered 2020-08-12: 100 mL via INTRAVENOUS

## 2020-08-17 ENCOUNTER — Other Ambulatory Visit: Payer: Self-pay

## 2020-08-17 ENCOUNTER — Emergency Department
Admission: EM | Admit: 2020-08-17 | Discharge: 2020-08-17 | Disposition: A | Discharge status: Home / Self Care | Payer: BC Managed Care – PPO | Attending: Emergency Medicine | Admitting: Emergency Medicine

## 2020-08-17 DIAGNOSIS — R109 Unspecified abdominal pain: Secondary | ICD-10-CM | POA: Diagnosis not present

## 2020-08-17 DIAGNOSIS — Z5321 Procedure and treatment not carried out due to patient leaving prior to being seen by health care provider: Secondary | ICD-10-CM | POA: Diagnosis not present

## 2020-08-17 DIAGNOSIS — M549 Dorsalgia, unspecified: Secondary | ICD-10-CM | POA: Diagnosis not present

## 2020-08-17 DIAGNOSIS — R110 Nausea: Secondary | ICD-10-CM | POA: Insufficient documentation

## 2020-08-17 LAB — LIPASE, BLOOD: Lipase: 64 U/L — ABNORMAL HIGH (ref 11–51)

## 2020-08-17 LAB — COMPREHENSIVE METABOLIC PANEL
ALT: 14 U/L (ref 0–44)
AST: 15 U/L (ref 15–41)
Albumin: 4.5 g/dL (ref 3.5–5.0)
Alkaline Phosphatase: 41 U/L (ref 38–126)
Anion gap: 10 (ref 5–15)
BUN: <5 mg/dL — ABNORMAL LOW (ref 6–20)
CO2: 27 mmol/L (ref 22–32)
Calcium: 9.1 mg/dL (ref 8.9–10.3)
Chloride: 102 mmol/L (ref 98–111)
Creatinine, Ser: 0.76 mg/dL (ref 0.44–1.00)
GFR calc Af Amer: >60 mL/min (ref >60)
GFR calc non Af Amer: >60 mL/min (ref >60)
Glucose, Bld: 89 mg/dL (ref 70–99)
Potassium: 3 mmol/L — ABNORMAL LOW (ref 3.5–5.1)
Sodium: 139 mmol/L (ref 135–145)
Total Bilirubin: 0.6 mg/dL (ref 0.3–1.2)
Total Protein: 7.6 g/dL (ref 6.5–8.1)

## 2020-08-17 LAB — URINALYSIS, COMPLETE (UACMP) WITH MICROSCOPIC
Bilirubin Urine: NEGATIVE
Glucose, UA: NEGATIVE mg/dL
Hgb urine dipstick: NEGATIVE
Ketones, ur: NEGATIVE mg/dL
Leukocytes,Ua: NEGATIVE
Nitrite: NEGATIVE
Protein, ur: NEGATIVE mg/dL
Specific Gravity, Urine: 1.002 — ABNORMAL LOW (ref 1.005–1.030)
pH: 6 (ref 5.0–8.0)

## 2020-08-17 LAB — CBC
HCT: 39.9 % (ref 36.0–46.0)
Hemoglobin: 14.1 g/dL (ref 12.0–15.0)
MCH: 30.6 pg (ref 26.0–34.0)
MCHC: 35.3 g/dL (ref 30.0–36.0)
MCV: 86.6 fL (ref 80.0–100.0)
Platelets: 337 10*3/uL (ref 150–400)
RBC: 4.61 MIL/uL (ref 3.87–5.11)
RDW: 12.2 % (ref 11.5–15.5)
WBC: 6.4 10*3/uL (ref 4.0–10.5)
nRBC: 0 % (ref 0.0–0.2)

## 2020-08-17 LAB — POCT PREGNANCY, URINE: Preg Test, Ur: NEGATIVE

## 2020-08-17 NOTE — ED Triage Notes (Signed)
Pt here with GI issues, left side pain and was referred to the ED by her primary. Pt also c/o back pain and nausea. Pt NAD in triage.

## 2020-08-18 ENCOUNTER — Ambulatory Visit
Admission: RE | Admit: 2020-08-18 | Discharge: 2020-08-18 | Disposition: A | Discharge status: Home / Self Care | Payer: BC Managed Care – PPO | Source: Ambulatory Visit | Attending: Student | Admitting: Student

## 2020-08-18 ENCOUNTER — Other Ambulatory Visit: Payer: Self-pay

## 2020-08-18 DIAGNOSIS — R1012 Left upper quadrant pain: Secondary | ICD-10-CM

## 2020-08-19 ENCOUNTER — Other Ambulatory Visit: Payer: Self-pay | Admitting: Student

## 2020-08-19 DIAGNOSIS — R1012 Left upper quadrant pain: Secondary | ICD-10-CM

## 2020-08-19 DIAGNOSIS — R9389 Abnormal findings on diagnostic imaging of other specified body structures: Secondary | ICD-10-CM

## 2020-08-19 DIAGNOSIS — R1013 Epigastric pain: Secondary | ICD-10-CM

## 2020-08-25 ENCOUNTER — Encounter
Admission: RE | Admit: 2020-08-25 | Discharge: 2020-08-25 | Disposition: A | Discharge status: Home / Self Care | Payer: BC Managed Care – PPO | Source: Ambulatory Visit | Attending: Student | Admitting: Student

## 2020-08-25 ENCOUNTER — Other Ambulatory Visit: Payer: Self-pay

## 2020-08-25 DIAGNOSIS — R1012 Left upper quadrant pain: Secondary | ICD-10-CM

## 2020-08-25 DIAGNOSIS — R1013 Epigastric pain: Secondary | ICD-10-CM | POA: Diagnosis present

## 2020-08-25 DIAGNOSIS — R9389 Abnormal findings on diagnostic imaging of other specified body structures: Secondary | ICD-10-CM | POA: Diagnosis present

## 2020-08-25 MED ORDER — TECHNETIUM TC 99M MEBROFENIN IV KIT
5.0000 | PACK | Freq: Once | INTRAVENOUS | Status: AC | PRN
  Administered 2020-08-25: 4.941 via INTRAVENOUS

## 2020-09-13 ENCOUNTER — Ambulatory Visit: Payer: Self-pay | Admitting: General Surgery

## 2020-09-13 ENCOUNTER — Encounter
Admission: RE | Admit: 2020-09-13 | Discharge: 2020-09-13 | Disposition: A | Discharge status: Home / Self Care | Payer: BC Managed Care – PPO | Source: Ambulatory Visit | Attending: General Surgery | Admitting: General Surgery

## 2020-09-13 ENCOUNTER — Other Ambulatory Visit: Payer: Self-pay

## 2020-09-13 DIAGNOSIS — Z01812 Encounter for preprocedural laboratory examination: Secondary | ICD-10-CM | POA: Diagnosis present

## 2020-09-13 DIAGNOSIS — Z20822 Contact with and (suspected) exposure to covid-19: Secondary | ICD-10-CM | POA: Insufficient documentation

## 2020-09-13 LAB — SARS CORONAVIRUS 2 (TAT 6-24 HRS): SARS Coronavirus 2: NEGATIVE

## 2020-09-13 MED ORDER — ORAL CARE MOUTH RINSE: 15.0000 mL | Freq: Once | OROMUCOSAL | Status: AC

## 2020-09-13 MED ORDER — INDOCYANINE GREEN 25 MG IV SOLR
1.2500 mg | Freq: Once | INTRAVENOUS | Status: AC
  Administered 2020-09-14: 1.25 mg via INTRAVENOUS
  Filled 2020-09-13: qty 0.5
  Filled 2020-09-13: qty 10

## 2020-09-13 MED ORDER — CHLORHEXIDINE GLUCONATE 0.12 % MT SOLN: 15.0000 mL | Freq: Once | OROMUCOSAL | Status: AC

## 2020-09-13 MED ORDER — LACTATED RINGERS IV SOLN: INTRAVENOUS | Status: DC

## 2020-09-13 MED ORDER — CIPROFLOXACIN IN D5W 400 MG/200ML IV SOLN
400.0000 mg | INTRAVENOUS | Status: AC
  Administered 2020-09-14: 400 mg via INTRAVENOUS

## 2020-09-13 MED ORDER — METRONIDAZOLE IN NACL 5-0.79 MG/ML-% IV SOLN
500.0000 mg | INTRAVENOUS | Status: AC
  Administered 2020-09-14: 500 mg via INTRAVENOUS
  Filled 2020-09-13: qty 100

## 2020-09-13 NOTE — H&P (Signed)
PATIENT PROFILE: Olivia Walter is a 34 y.o. female who presents to the Clinic for consultation at the request of Dr. London for evaluation of cholelithiasis.  PCP:  Provider  HISTORY OF PRESENT ILLNESS: Olivia Walter reports she has been having abdominal pain for the last few weeks.  The patient reported the pain is localized to the upper abdomen.  The pain radiates to her back.  Sometime the pain is localized to the left upper quadrant but something is in the right upper quadrant.  The pain is aggravated by any oral intake.  She has associated nausea but no vomiting.  She has some diarrhea.  There has been no alleviating factors.  The patient had ultrasound done that shows cholelithiasis.  HIDA scan shows normal ejection fraction.  Patient reports that her female family members has had the gallbladder removed with similar symptoms.  Patient also complaining of discomfort in the umbilical area due to umbilical hernia.   PROBLEM LIST:         Problem List  Date Reviewed: 07/16/2019         Noted   Cyst of right ovary Unknown   Positive ANA (antinuclear antibody) 08/01/2017   Elevated erythrocyte sedimentation rate 08/01/2017   Pleurisy 08/01/2017   Skin rash 08/01/2017   Polyarthralgia, unspecified 08/01/2017   Pelvic pain in female Unknown      GENERAL REVIEW OF SYSTEMS:   General ROS: negative for - chills, fatigue, fever, weight gain or weight loss Allergy and Immunology ROS: negative for - hives  Hematological and Lymphatic ROS: negative for - bleeding problems or bruising, negative for palpable nodes Endocrine ROS: negative for - heat or cold intolerance, hair changes Respiratory ROS: negative for - cough, shortness of breath or wheezing Cardiovascular ROS: no chest pain or palpitations GI ROS: negative for nausea, vomiting, constipation.  Positive for nausea, abdominal pain and diarrhea Musculoskeletal ROS: negative for - joint swelling or muscle pain Neurological ROS: negative  for - confusion, syncope Dermatological ROS: negative for pruritus and rash Psychiatric: negative for anxiety, depression, difficulty sleeping and memory loss  MEDICATIONS: Current Medications        Current Outpatient Medications  Medication Sig Dispense Refill  . cyclobenzaprine (FLEXERIL) 5 MG tablet Take by mouth as needed    . famotidine (PEPCID) 40 MG tablet Take 1 tablet (40 mg total) by mouth nightly (Patient taking differently: Take 40 mg by mouth nightly as needed   ) 30 tablet 5  . ibuprofen (MOTRIN) 200 MG tablet Take by mouth       . medroxyPROGESTERone (DEPO-PROVERA) 150 mg/mL injection Inject 1 mL (150 mg total) into the muscle every 3 (three) months 1 mL 4  . SUMAtriptan (IMITREX) 50 MG tablet Take 1 tablet (50 mg total) by mouth as directed for Migraine May take a second dose after 2 hours if needed. 10 tablet 11  . ergocalciferol, vitamin D2, 1,250 mcg (50,000 unit) capsule Take 1 capsule (50,000 Units total) by mouth once a week for 12 doses .  Once this prescription is finished, start taking over-the-counter Vitamin D (2,000 IU) daily. (Patient not taking: Reported on 08/11/2020  ) 12 capsule 0   No current facility-administered medications for this visit.      ALLERGIES: Clindamycin hcl, Penicillin, Sulfamethoxazole-trimethoprim, and Venom-honey bee  PAST MEDICAL HISTORY:     Past Medical History:  Diagnosis Date  . Kidney stones   . Osteoarthritis   . Reactive hypoglycemia   . Reflux esophagitis 10/01/2016      PAST SURGICAL HISTORY:      Past Surgical History:  Procedure Laterality Date  . CAPSULE ENDOSCOPY GASTROINTESTINAL TRACT  09/05/2009  . COLONOSCOPY    . EGD  10/01/2016   Reflux esophagitis/Intestinal metaplasia/Repeat 36yr/MUS Recall ltr mailed   . EGD  07/28/2020   Gastritis/Otherwise normal EGD/Repeat as needed/CTL  . gangelion cyst    . HYSTERECTOMY  11/2017   total hysterectomy, right ovary was NOT removed  . ORAL  SURGERY OPERATIVE NOTE    . UPPER GASTROINTESTINAL ENDOSCOPY       FAMILY HISTORY:      Family History  Problem Relation Age of Onset  . Heart disease Mother   . Liver disease Mother   . Diabetes type II Mother   . Hyperlipidemia (Elevated cholesterol) Mother   . High blood pressure (Hypertension) Mother   . Hyperlipidemia (Elevated cholesterol) Father   . High blood pressure (Hypertension) Father   . Crohn's disease Sister   . Brain cancer Maternal Uncle   . Multiple sclerosis Maternal Uncle   . Celiac disease Sister   . Schizophrenia Brother   . Stroke Maternal Grandmother   . Coronary Artery Disease (Blocked arteries around heart) Paternal Grandfather   . Breast cancer Neg Hx   . Colon cancer Neg Hx   . Thyroid disease Neg Hx      SOCIAL HISTORY: Social History          Socioeconomic History  . Marital status: Married    Spouse name: TNicole Kindred . Number of children: Not on file  . Years of education: Not on file  . Highest education level: Not on file  Occupational History  . 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 Smoking status: Former Smoker    Packs/day: 0.50    Types: Cigarettes  . Smokeless tobacco: Never Used  Vaping Use  . Vaping Use: Some days  Substance and Sexual Activity  . Alcohol use: No  . Drug use: No  . Sexual activity: Not Currently    Partners: Male    Birth control/protection: Surgical  Other Topics Concern  . Not on file  Social History Narrative  . Not on file   Social Determinants of Health   Financial Resource Strain: Not on file  Food Insecurity: Not on file  Transportation Needs: Not on file      PHYSICAL EXAM:    Vitals:   09/13/20 1340  BP: 101/73  Pulse: 81   Body mass index is 28.35 kg/m. Weight: 70.3 kg (155 lb)   GENERAL: Alert, active, oriented x3  HEENT: Pupils equal reactive to light. Extraocular movements are intact. Sclera clear. Palpebral conjunctiva  normal red color.Pharynx clear.  NECK: Supple with no palpable mass and no adenopathy.  LUNGS: Sound clear with no rales rhonchi or wheezes.  HEART: Regular rhythm S1 and S2 without murmur.  ABDOMEN: Soft and depressible, nontender with no palpable mass, no hepatomegaly.   EXTREMITIES: Well-developed well-nourished symmetrical with no dependent edema.  NEUROLOGICAL: Awake alert oriented, facial expression symmetrical, moving all extremities.  REVIEW OF DATA: I have reviewed the following data today:      Appointment on 08/15/2020  Component Date Value  . Lipase 08/15/2020 15   . Pancreatic Elastase, Fec* 08/15/2020 >500   Office Visit on 08/11/2020  Component Date Value  . WBC (White Blood Cell Co* 08/11/2020 5.5   . RBC (Red Blood Cell Coun* 08/11/2020 4.46   . Hemoglobin 08/11/2020 13.8   . Hematocrit 08/11/2020  40.1   . MCV (Mean Corpuscular Vo* 08/11/2020 89.9   . MCH (Mean Corpuscular He* 08/11/2020 30.9   . MCHC (Mean Corpuscular H* 08/11/2020 34.4   . Platelet Count 08/11/2020 333   . RDW-CV (Red Cell Distrib* 08/11/2020 12.5   . MPV (Mean Platelet Volum* 08/11/2020 10.3   . Neutrophils 08/11/2020 2.78   . Lymphocytes 08/11/2020 2.05   . Monocytes 08/11/2020 0.42   . Eosinophils 08/11/2020 0.16   . Basophils 08/11/2020 0.03   . Neutrophil % 08/11/2020 51.0   . Lymphocyte % 08/11/2020 37.6   . Monocyte % 08/11/2020 7.7   . Eosinophil % 08/11/2020 2.9   . Basophil% 08/11/2020 0.6   . Immature Granulocyte % 08/11/2020 0.2   . Immature Granulocyte Cou* 08/11/2020 0.01   . Glucose 08/11/2020 81   . Sodium 08/11/2020 140   . Potassium 08/11/2020 3.8   . Chloride 08/11/2020 105   . Carbon Dioxide (CO2) 08/11/2020 27.0   . Urea Nitrogen (BUN) 08/11/2020 7   . Creatinine 08/11/2020 0.8   . Glomerular Filtration Ra* 08/11/2020 82   . Calcium 08/11/2020 9.5   . AST  08/11/2020 13   . ALT  08/11/2020 11   . Alk Phos (alkaline Phosp* 08/11/2020 41   . Albumin  08/11/2020 4.4   . Bilirubin, Total 08/11/2020 0.5   . Protein, Total 08/11/2020 6.6   . A/G Ratio 08/11/2020 2.0   . Lipase 08/11/2020 141*  Appointment on 07/26/2020  Component Date Value  . ID Now COVID-19 - [facility]* 07/26/2020 Negative      ASSESSMENT: Olivia Walter is a 34 y.o. female presenting for consultation for cholelithiasis.    Patient was oriented about the diagnosis of cholelithiasis. Also oriented about what is the gallbladder, its anatomy and function and the implications of having stones. The patient was oriented about the treatment alternatives (observation vs cholecystectomy). Patient was oriented that a low percentage of patient will continue to have similar pain symptoms even after the gallbladder is removed. Surgical technique (open vs laparoscopic) was discussed. It was also discussed the goals of the surgery (decrease the pain episodes and avoid the risk of cholecystitis) and the risk of surgery including: bleeding, infection, common bile duct injury, stone retention, injury to other organs such as bowel, liver, stomach, other complications such as hernia, bowel obstruction among others. Also discussed with patient about anesthesia and its complications such as: reaction to medications, pneumonia, heart complications, death, among others.  Cholelithiasis without cholecystitis [K80.20]  PLAN: 1. Robotic assisted laparoscopic cholecystectomy (47562) 2.  Umbilical hernia repair (49585) 3.  Do not take aspirin 5 days before the procedure 4.  Contact us if has any question or concern.  Patient verbalized understanding, all questions were answered, and were agreeable with the plan outlined above.     Carlicia Leavens Cintron-Diaz, MD  Electronically signed by Desha Bitner Cintron-Diaz, MD  

## 2020-09-13 NOTE — H&P (View-Only) (Signed)
PATIENT PROFILE: Olivia Walter is a 34 y.o. female who presents to the Clinic for consultation at the request of Dr. London for evaluation of cholelithiasis.  PCP:  Provider  HISTORY OF PRESENT ILLNESS: Olivia Walter reports she has been having abdominal pain for the last few weeks.  The patient reported the pain is localized to the upper abdomen.  The pain radiates to her back.  Sometime the pain is localized to the left upper quadrant but something is in the right upper quadrant.  The pain is aggravated by any oral intake.  She has associated nausea but no vomiting.  She has some diarrhea.  There has been no alleviating factors.  The patient had ultrasound done that shows cholelithiasis.  HIDA scan shows normal ejection fraction.  Patient reports that her female family members has had the gallbladder removed with similar symptoms.  Patient also complaining of discomfort in the umbilical area due to umbilical hernia.   PROBLEM LIST:         Problem List  Date Reviewed: 07/16/2019         Noted   Cyst of right ovary Unknown   Positive ANA (antinuclear antibody) 08/01/2017   Elevated erythrocyte sedimentation rate 08/01/2017   Pleurisy 08/01/2017   Skin rash 08/01/2017   Polyarthralgia, unspecified 08/01/2017   Pelvic pain in female Unknown      GENERAL REVIEW OF SYSTEMS:   General ROS: negative for - chills, fatigue, fever, weight gain or weight loss Allergy and Immunology ROS: negative for - hives  Hematological and Lymphatic ROS: negative for - bleeding problems or bruising, negative for palpable nodes Endocrine ROS: negative for - heat or cold intolerance, hair changes Respiratory ROS: negative for - cough, shortness of breath or wheezing Cardiovascular ROS: no chest pain or palpitations GI ROS: negative for nausea, vomiting, constipation.  Positive for nausea, abdominal pain and diarrhea Musculoskeletal ROS: negative for - joint swelling or muscle pain Neurological ROS: negative  for - confusion, syncope Dermatological ROS: negative for pruritus and rash Psychiatric: negative for anxiety, depression, difficulty sleeping and memory loss  MEDICATIONS: Current Medications        Current Outpatient Medications  Medication Sig Dispense Refill  . cyclobenzaprine (FLEXERIL) 5 MG tablet Take by mouth as needed    . famotidine (PEPCID) 40 MG tablet Take 1 tablet (40 mg total) by mouth nightly (Patient taking differently: Take 40 mg by mouth nightly as needed   ) 30 tablet 5  . ibuprofen (MOTRIN) 200 MG tablet Take by mouth       . medroxyPROGESTERone (DEPO-PROVERA) 150 mg/mL injection Inject 1 mL (150 mg total) into the muscle every 3 (three) months 1 mL 4  . SUMAtriptan (IMITREX) 50 MG tablet Take 1 tablet (50 mg total) by mouth as directed for Migraine May take a second dose after 2 hours if needed. 10 tablet 11  . ergocalciferol, vitamin D2, 1,250 mcg (50,000 unit) capsule Take 1 capsule (50,000 Units total) by mouth once a week for 12 doses .  Once this prescription is finished, start taking over-the-counter Vitamin D (2,000 IU) daily. (Patient not taking: Reported on 08/11/2020  ) 12 capsule 0   No current facility-administered medications for this visit.      ALLERGIES: Clindamycin hcl, Penicillin, Sulfamethoxazole-trimethoprim, and Venom-honey bee  PAST MEDICAL HISTORY:     Past Medical History:  Diagnosis Date  . Kidney stones   . Osteoarthritis   . Reactive hypoglycemia   . Reflux esophagitis 10/01/2016      PAST SURGICAL HISTORY:      Past Surgical History:  Procedure Laterality Date  . CAPSULE ENDOSCOPY GASTROINTESTINAL TRACT  09/05/2009  . COLONOSCOPY    . EGD  10/01/2016   Reflux esophagitis/Intestinal metaplasia/Repeat 36yr/MUS Recall ltr mailed   . EGD  07/28/2020   Gastritis/Otherwise normal EGD/Repeat as needed/CTL  . gangelion cyst    . HYSTERECTOMY  11/2017   total hysterectomy, right ovary was NOT removed  . ORAL  SURGERY OPERATIVE NOTE    . UPPER GASTROINTESTINAL ENDOSCOPY       FAMILY HISTORY:      Family History  Problem Relation Age of Onset  . Heart disease Mother   . Liver disease Mother   . Diabetes type II Mother   . Hyperlipidemia (Elevated cholesterol) Mother   . High blood pressure (Hypertension) Mother   . Hyperlipidemia (Elevated cholesterol) Father   . High blood pressure (Hypertension) Father   . Crohn's disease Sister   . Brain cancer Maternal Uncle   . Multiple sclerosis Maternal Uncle   . Celiac disease Sister   . Schizophrenia Brother   . Stroke Maternal Grandmother   . Coronary Artery Disease (Blocked arteries around heart) Paternal Grandfather   . Breast cancer Neg Hx   . Colon cancer Neg Hx   . Thyroid disease Neg Hx      SOCIAL HISTORY: Social History          Socioeconomic History  . Marital status: Married    Spouse name: Olivia Walter . Number of children: Not on file  . Years of education: Not on file  . Highest education level: Not on file  Occupational History  . 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 Smoking status: Former Smoker    Packs/day: 0.50    Types: Cigarettes  . Smokeless tobacco: Never Used  Vaping Use  . Vaping Use: Some days  Substance and Sexual Activity  . Alcohol use: No  . Drug use: No  . Sexual activity: Not Currently    Partners: Male    Birth control/protection: Surgical  Other Topics Concern  . Not on file  Social History Narrative  . Not on file   Social Determinants of Health   Financial Resource Strain: Not on file  Food Insecurity: Not on file  Transportation Needs: Not on file      PHYSICAL EXAM:    Vitals:   09/13/20 1340  BP: 101/73  Pulse: 81   Body mass index is 28.35 kg/m. Weight: 70.3 kg (155 lb)   GENERAL: Alert, active, oriented x3  HEENT: Pupils equal reactive to light. Extraocular movements are intact. Sclera clear. Palpebral conjunctiva  normal red color.Pharynx clear.  NECK: Supple with no palpable mass and no adenopathy.  LUNGS: Sound clear with no rales rhonchi or wheezes.  HEART: Regular rhythm S1 and S2 without murmur.  ABDOMEN: Soft and depressible, nontender with no palpable mass, no hepatomegaly.   EXTREMITIES: Well-developed well-nourished symmetrical with no dependent edema.  NEUROLOGICAL: Awake alert oriented, facial expression symmetrical, moving all extremities.  REVIEW OF DATA: I have reviewed the following data today:      Appointment on 08/15/2020  Component Date Value  . Lipase 08/15/2020 15   . Pancreatic Elastase, Fec* 08/15/2020 >500   Office Visit on 08/11/2020  Component Date Value  . WBC (White Blood Cell Co* 08/11/2020 5.5   . RBC (Red Blood Cell Coun* 08/11/2020 4.46   . Hemoglobin 08/11/2020 13.8   . Hematocrit 08/11/2020  40.1   . MCV (Mean Corpuscular Vo* 08/11/2020 89.9   . MCH (Mean Corpuscular He* 08/11/2020 30.9   . MCHC (Mean Corpuscular H* 08/11/2020 34.4   . Platelet Count 08/11/2020 333   . RDW-CV (Red Cell Distrib* 08/11/2020 12.5   . MPV (Mean Platelet Volum* 08/11/2020 10.3   . Neutrophils 08/11/2020 2.78   . Lymphocytes 08/11/2020 2.05   . Monocytes 08/11/2020 0.42   . Eosinophils 08/11/2020 0.16   . Basophils 08/11/2020 0.03   . Neutrophil % 08/11/2020 51.0   . Lymphocyte % 08/11/2020 37.6   . Monocyte % 08/11/2020 7.7   . Eosinophil % 08/11/2020 2.9   . Basophil% 08/11/2020 0.6   . Immature Granulocyte % 08/11/2020 0.2   . Immature Granulocyte Cou* 08/11/2020 0.01   . Glucose 08/11/2020 81   . Sodium 08/11/2020 140   . Potassium 08/11/2020 3.8   . Chloride 08/11/2020 105   . Carbon Dioxide (CO2) 08/11/2020 27.0   . Urea Nitrogen (BUN) 08/11/2020 7   . Creatinine 08/11/2020 0.8   . Glomerular Filtration Ra* 08/11/2020 82   . Calcium 08/11/2020 9.5   . AST  08/11/2020 13   . ALT  08/11/2020 11   . Alk Phos (alkaline Phosp* 08/11/2020 41   . Albumin  08/11/2020 4.4   . Bilirubin, Total 08/11/2020 0.5   . Protein, Total 08/11/2020 6.6   . A/G Ratio 08/11/2020 2.0   . Lipase 08/11/2020 141*  Appointment on 07/26/2020  Component Date Value  . ID Now COVID-19 - [facility]* 07/26/2020 Negative      ASSESSMENT: Ms. Ehinger is a 34 y.o. female presenting for consultation for cholelithiasis.    Patient was oriented about the diagnosis of cholelithiasis. Also oriented about what is the gallbladder, its anatomy and function and the implications of having stones. The patient was oriented about the treatment alternatives (observation vs cholecystectomy). Patient was oriented that a low percentage of patient will continue to have similar pain symptoms even after the gallbladder is removed. Surgical technique (open vs laparoscopic) was discussed. It was also discussed the goals of the surgery (decrease the pain episodes and avoid the risk of cholecystitis) and the risk of surgery including: bleeding, infection, common bile duct injury, stone retention, injury to other organs such as bowel, liver, stomach, other complications such as hernia, bowel obstruction among others. Also discussed with patient about anesthesia and its complications such as: reaction to medications, pneumonia, heart complications, death, among others.  Cholelithiasis without cholecystitis [K80.20]  PLAN: 1. Robotic assisted laparoscopic cholecystectomy (47562) 2.  Umbilical hernia repair (49585) 3.  Do not take aspirin 5 days before the procedure 4.  Contact us if has any question or concern.  Patient verbalized understanding, all questions were answered, and were agreeable with the plan outlined above.     Edgardo Cintron-Diaz, MD  Electronically signed by Edgardo Cintron-Diaz, MD  

## 2020-09-14 ENCOUNTER — Other Ambulatory Visit: Payer: Self-pay

## 2020-09-14 ENCOUNTER — Ambulatory Visit
Admission: RE | Admit: 2020-09-14 | Discharge: 2020-09-14 | Disposition: A | Discharge status: Home / Self Care | Payer: BC Managed Care – PPO | Attending: General Surgery | Admitting: General Surgery

## 2020-09-14 ENCOUNTER — Encounter
Admission: RE | Disposition: A | Discharge status: Home / Self Care | Payer: Self-pay | Source: Home / Self Care | Attending: General Surgery

## 2020-09-14 ENCOUNTER — Encounter: Payer: Self-pay | Admitting: General Surgery

## 2020-09-14 ENCOUNTER — Ambulatory Visit: Payer: BC Managed Care – PPO | Admitting: Anesthesiology

## 2020-09-14 DIAGNOSIS — Z818 Family history of other mental and behavioral disorders: Secondary | ICD-10-CM | POA: Insufficient documentation

## 2020-09-14 DIAGNOSIS — Z79899 Other long term (current) drug therapy: Secondary | ICD-10-CM | POA: Diagnosis not present

## 2020-09-14 DIAGNOSIS — Z793 Long term (current) use of hormonal contraceptives: Secondary | ICD-10-CM | POA: Insufficient documentation

## 2020-09-14 DIAGNOSIS — Z791 Long term (current) use of non-steroidal anti-inflammatories (NSAID): Secondary | ICD-10-CM | POA: Diagnosis not present

## 2020-09-14 DIAGNOSIS — Z87442 Personal history of urinary calculi: Secondary | ICD-10-CM | POA: Insufficient documentation

## 2020-09-14 DIAGNOSIS — Z881 Allergy status to other antibiotic agents status: Secondary | ICD-10-CM | POA: Insufficient documentation

## 2020-09-14 DIAGNOSIS — Z90710 Acquired absence of both cervix and uterus: Secondary | ICD-10-CM | POA: Insufficient documentation

## 2020-09-14 DIAGNOSIS — K429 Umbilical hernia without obstruction or gangrene: Secondary | ICD-10-CM | POA: Diagnosis not present

## 2020-09-14 DIAGNOSIS — Z91030 Bee allergy status: Secondary | ICD-10-CM | POA: Insufficient documentation

## 2020-09-14 DIAGNOSIS — Z808 Family history of malignant neoplasm of other organs or systems: Secondary | ICD-10-CM | POA: Diagnosis not present

## 2020-09-14 DIAGNOSIS — Z823 Family history of stroke: Secondary | ICD-10-CM | POA: Insufficient documentation

## 2020-09-14 DIAGNOSIS — Z882 Allergy status to sulfonamides status: Secondary | ICD-10-CM | POA: Diagnosis not present

## 2020-09-14 DIAGNOSIS — Z820 Family history of epilepsy and other diseases of the nervous system: Secondary | ICD-10-CM | POA: Diagnosis not present

## 2020-09-14 DIAGNOSIS — K828 Other specified diseases of gallbladder: Secondary | ICD-10-CM | POA: Diagnosis not present

## 2020-09-14 DIAGNOSIS — K219 Gastro-esophageal reflux disease without esophagitis: Secondary | ICD-10-CM | POA: Diagnosis not present

## 2020-09-14 DIAGNOSIS — K8010 Calculus of gallbladder with chronic cholecystitis without obstruction: Secondary | ICD-10-CM | POA: Diagnosis present

## 2020-09-14 DIAGNOSIS — Z87891 Personal history of nicotine dependence: Secondary | ICD-10-CM | POA: Insufficient documentation

## 2020-09-14 DIAGNOSIS — Z880 Allergy status to penicillin: Secondary | ICD-10-CM | POA: Diagnosis not present

## 2020-09-14 DIAGNOSIS — N83201 Unspecified ovarian cyst, right side: Secondary | ICD-10-CM | POA: Insufficient documentation

## 2020-09-14 DIAGNOSIS — Z8349 Family history of other endocrine, nutritional and metabolic diseases: Secondary | ICD-10-CM | POA: Diagnosis not present

## 2020-09-14 DIAGNOSIS — Z8379 Family history of other diseases of the digestive system: Secondary | ICD-10-CM | POA: Diagnosis not present

## 2020-09-14 DIAGNOSIS — M1990 Unspecified osteoarthritis, unspecified site: Secondary | ICD-10-CM | POA: Diagnosis not present

## 2020-09-14 DIAGNOSIS — Z888 Allergy status to other drugs, medicaments and biological substances status: Secondary | ICD-10-CM | POA: Diagnosis not present

## 2020-09-14 DIAGNOSIS — Z8249 Family history of ischemic heart disease and other diseases of the circulatory system: Secondary | ICD-10-CM | POA: Diagnosis not present

## 2020-09-14 DIAGNOSIS — Z833 Family history of diabetes mellitus: Secondary | ICD-10-CM | POA: Diagnosis not present

## 2020-09-14 SURGERY — CHOLECYSTECTOMY, ROBOT-ASSISTED, LAPAROSCOPIC
Anesthesia: General | Site: Abdomen

## 2020-09-14 MED ORDER — KETAMINE HCL 50 MG/ML IJ SOLN
INTRAMUSCULAR | Status: DC | PRN
  Administered 2020-09-14: 30 mg via INTRAMUSCULAR
  Administered 2020-09-14: 10 mg via INTRAMUSCULAR

## 2020-09-14 MED ORDER — CIPROFLOXACIN IN D5W 400 MG/200ML IV SOLN
INTRAVENOUS | Status: AC
  Filled 2020-09-14: qty 200

## 2020-09-14 MED ORDER — ROCURONIUM BROMIDE 10 MG/ML (PF) SYRINGE
PREFILLED_SYRINGE | INTRAVENOUS | Status: AC
  Filled 2020-09-14: qty 10

## 2020-09-14 MED ORDER — DEXAMETHASONE SODIUM PHOSPHATE 10 MG/ML IJ SOLN
INTRAMUSCULAR | Status: AC
  Filled 2020-09-14: qty 1

## 2020-09-14 MED ORDER — ONDANSETRON HCL 4 MG/2ML IJ SOLN
INTRAMUSCULAR | Status: DC | PRN
  Administered 2020-09-14: 4 mg via INTRAVENOUS

## 2020-09-14 MED ORDER — FENTANYL CITRATE (PF) 100 MCG/2ML IJ SOLN
INTRAMUSCULAR | Status: AC
  Filled 2020-09-14: qty 2

## 2020-09-14 MED ORDER — ONDANSETRON HCL 4 MG/2ML IJ SOLN
4.0000 mg | Freq: Once | INTRAMUSCULAR | Status: AC | PRN
  Administered 2020-09-14: 4 mg via INTRAVENOUS

## 2020-09-14 MED ORDER — MIDAZOLAM HCL 2 MG/2ML IJ SOLN
INTRAMUSCULAR | Status: DC | PRN
  Administered 2020-09-14: 2 mg via INTRAVENOUS

## 2020-09-14 MED ORDER — HYDROCODONE-ACETAMINOPHEN 5-325 MG PO TABS
1.0000 | ORAL_TABLET | ORAL | Status: DC | PRN
  Administered 2020-09-14: 1 via ORAL

## 2020-09-14 MED ORDER — DEXAMETHASONE SODIUM PHOSPHATE 10 MG/ML IJ SOLN
INTRAMUSCULAR | Status: DC | PRN
  Administered 2020-09-14: 10 mg via INTRAVENOUS

## 2020-09-14 MED ORDER — SUGAMMADEX SODIUM 200 MG/2ML IV SOLN
INTRAVENOUS | Status: DC | PRN
  Administered 2020-09-14: 200 mg via INTRAVENOUS

## 2020-09-14 MED ORDER — FENTANYL CITRATE (PF) 100 MCG/2ML IJ SOLN
INTRAMUSCULAR | Status: DC | PRN
  Administered 2020-09-14: 100 ug via INTRAVENOUS

## 2020-09-14 MED ORDER — CHLORHEXIDINE GLUCONATE 0.12 % MT SOLN
OROMUCOSAL | Status: AC
  Administered 2020-09-14: 15 mL via OROMUCOSAL
  Filled 2020-09-14: qty 15

## 2020-09-14 MED ORDER — BUPIVACAINE-EPINEPHRINE 0.25% -1:200000 IJ SOLN
INTRAMUSCULAR | Status: DC | PRN
  Administered 2020-09-14: 17 mL

## 2020-09-14 MED ORDER — MIDAZOLAM HCL 2 MG/2ML IJ SOLN
INTRAMUSCULAR | Status: AC
  Filled 2020-09-14: qty 2

## 2020-09-14 MED ORDER — PHENYLEPHRINE HCL (PRESSORS) 10 MG/ML IV SOLN
INTRAVENOUS | Status: DC | PRN
  Administered 2020-09-14: 100 ug via INTRAVENOUS
  Administered 2020-09-14: 200 ug via INTRAVENOUS
  Administered 2020-09-14 (×3): 100 ug via INTRAVENOUS
  Administered 2020-09-14: 200 ug via INTRAVENOUS
  Administered 2020-09-14 (×2): 100 ug via INTRAVENOUS

## 2020-09-14 MED ORDER — PROPOFOL 10 MG/ML IV BOLUS
INTRAVENOUS | Status: DC | PRN
  Administered 2020-09-14: 150 mg via INTRAVENOUS

## 2020-09-14 MED ORDER — HYDROCODONE-ACETAMINOPHEN 5-325 MG PO TABS: 1.0000 | ORAL_TABLET | ORAL | 0 refills | Status: AC | PRN

## 2020-09-14 MED ORDER — ONDANSETRON HCL 4 MG/2ML IJ SOLN
INTRAMUSCULAR | Status: AC
  Filled 2020-09-14: qty 2

## 2020-09-14 MED ORDER — GLYCOPYRROLATE 0.2 MG/ML IJ SOLN
INTRAMUSCULAR | Status: DC | PRN
  Administered 2020-09-14: .2 mg via INTRAVENOUS

## 2020-09-14 MED ORDER — BUPIVACAINE-EPINEPHRINE (PF) 0.25% -1:200000 IJ SOLN
INTRAMUSCULAR | Status: AC
  Filled 2020-09-14: qty 30

## 2020-09-14 MED ORDER — LIDOCAINE HCL (CARDIAC) PF 100 MG/5ML IV SOSY
PREFILLED_SYRINGE | INTRAVENOUS | Status: DC | PRN
  Administered 2020-09-14: 60 mg via INTRAVENOUS

## 2020-09-14 MED ORDER — ROCURONIUM BROMIDE 100 MG/10ML IV SOLN
INTRAVENOUS | Status: DC | PRN
  Administered 2020-09-14: 50 mg via INTRAVENOUS

## 2020-09-14 MED ORDER — HYDROCODONE-ACETAMINOPHEN 5-325 MG PO TABS
ORAL_TABLET | ORAL | Status: AC
  Filled 2020-09-14: qty 1

## 2020-09-14 MED ORDER — LIDOCAINE HCL (PF) 2 % IJ SOLN
INTRAMUSCULAR | Status: AC
  Filled 2020-09-14: qty 5

## 2020-09-14 MED ORDER — DEXMEDETOMIDINE (PRECEDEX) IN NS 20 MCG/5ML (4 MCG/ML) IV SYRINGE
PREFILLED_SYRINGE | INTRAVENOUS | Status: DC | PRN
  Administered 2020-09-14 (×2): 8 ug via INTRAVENOUS
  Administered 2020-09-14: 4 ug via INTRAVENOUS

## 2020-09-14 MED ORDER — FENTANYL CITRATE (PF) 100 MCG/2ML IJ SOLN
25.0000 ug | INTRAMUSCULAR | Status: DC | PRN
  Administered 2020-09-14 (×4): 25 ug via INTRAVENOUS

## 2020-09-14 MED ORDER — PROPOFOL 10 MG/ML IV BOLUS
INTRAVENOUS | Status: AC
  Filled 2020-09-14: qty 20

## 2020-09-14 MED ORDER — ACETAMINOPHEN 10 MG/ML IV SOLN
INTRAVENOUS | Status: DC | PRN
  Administered 2020-09-14: 1000 mg via INTRAVENOUS

## 2020-09-14 MED ORDER — ACETAMINOPHEN 10 MG/ML IV SOLN
INTRAVENOUS | Status: AC
  Filled 2020-09-14: qty 100

## 2020-09-14 SURGICAL SUPPLY — 55 items
ADH SKN CLS APL DERMABOND .7 (GAUZE/BANDAGES/DRESSINGS) ×1
APL PRP STRL LF DISP 70% ISPRP (MISCELLANEOUS) ×1
BAG INFUSER PRESSURE 100CC (MISCELLANEOUS) IMPLANT
BAG SPEC RTRVL LRG 6X4 10 (ENDOMECHANICALS) ×1
BLADE SURG SZ11 CARB STEEL (BLADE) ×2 IMPLANT
CANISTER SUCT 1200ML W/VALVE (MISCELLANEOUS) ×2 IMPLANT
CANNULA REDUC XI 12-8 STAPL (CANNULA) ×1
CANNULA REDUCER 12-8 DVNC XI (CANNULA) ×1 IMPLANT
CHLORAPREP W/TINT 26 (MISCELLANEOUS) ×2 IMPLANT
CLIP VESOLOCK MED LG 6/CT (CLIP) ×4 IMPLANT
COVER WAND RF STERILE (DRAPES) ×2 IMPLANT
DECANTER SPIKE VIAL GLASS SM (MISCELLANEOUS) ×4 IMPLANT
DEFOGGER SCOPE WARMER CLEARIFY (MISCELLANEOUS) ×2 IMPLANT
DERMABOND ADVANCED (GAUZE/BANDAGES/DRESSINGS) ×1
DERMABOND ADVANCED .7 DNX12 (GAUZE/BANDAGES/DRESSINGS) ×1 IMPLANT
DRAPE ARM DVNC X/XI (DISPOSABLE) ×4 IMPLANT
DRAPE COLUMN DVNC XI (DISPOSABLE) ×1 IMPLANT
DRAPE DA VINCI XI ARM (DISPOSABLE) ×4
DRAPE DA VINCI XI COLUMN (DISPOSABLE) ×1
ELECT REM PT RETURN 9FT ADLT (ELECTROSURGICAL) ×2
ELECTRODE REM PT RTRN 9FT ADLT (ELECTROSURGICAL) ×1 IMPLANT
GLOVE BIO SURGEON STRL SZ 6.5 (GLOVE) ×4 IMPLANT
GLOVE BIOGEL PI IND STRL 6.5 (GLOVE) ×2 IMPLANT
GLOVE BIOGEL PI INDICATOR 6.5 (GLOVE) ×2
GOWN STRL REUS W/ TWL LRG LVL3 (GOWN DISPOSABLE) ×3 IMPLANT
GOWN STRL REUS W/TWL LRG LVL3 (GOWN DISPOSABLE) ×6
GRASPER SUT TROCAR 14GX15 (MISCELLANEOUS) IMPLANT
IRRIGATOR SUCT 8 DISP DVNC XI (IRRIGATION / IRRIGATOR) IMPLANT
IRRIGATOR SUCTION 8MM XI DISP (IRRIGATION / IRRIGATOR)
IV NS 1000ML (IV SOLUTION)
IV NS 1000ML BAXH (IV SOLUTION) IMPLANT
KIT PINK PAD W/HEAD ARE REST (MISCELLANEOUS) ×2
KIT PINK PAD W/HEAD ARM REST (MISCELLANEOUS) ×1 IMPLANT
LABEL OR SOLS (LABEL) ×2 IMPLANT
NEEDLE HYPO 22GX1.5 SAFETY (NEEDLE) ×2 IMPLANT
NEEDLE INSUFFLATION 14GA 120MM (NEEDLE) ×2 IMPLANT
NS IRRIG 500ML POUR BTL (IV SOLUTION) ×2 IMPLANT
OBTURATOR OPTICAL STANDARD 8MM (TROCAR) ×1
OBTURATOR OPTICAL STND 8 DVNC (TROCAR) ×1
OBTURATOR OPTICALSTD 8 DVNC (TROCAR) ×1 IMPLANT
PACK LAP CHOLECYSTECTOMY (MISCELLANEOUS) ×2 IMPLANT
PENCIL ELECTRO HAND CTR (MISCELLANEOUS) ×2 IMPLANT
POUCH SPECIMEN RETRIEVAL 10MM (ENDOMECHANICALS) ×2 IMPLANT
SEAL CANN UNIV 5-8 DVNC XI (MISCELLANEOUS) ×3 IMPLANT
SEAL XI 5MM-8MM UNIVERSAL (MISCELLANEOUS) ×3
SET TUBE SMOKE EVAC HIGH FLOW (TUBING) ×2 IMPLANT
SOLUTION ELECTROLUBE (MISCELLANEOUS) ×2 IMPLANT
STAPLER CANNULA SEAL DVNC XI (STAPLE) ×1 IMPLANT
STAPLER CANNULA SEAL XI (STAPLE) ×1
SUT MNCRL 4-0 (SUTURE) ×2
SUT MNCRL 4-0 27XMFL (SUTURE) ×1
SUT VIC AB 3-0 SH 27 (SUTURE)
SUT VIC AB 3-0 SH 27X BRD (SUTURE) IMPLANT
SUT VICRYL 0 AB UR-6 (SUTURE) ×2 IMPLANT
SUTURE MNCRL 4-0 27XMF (SUTURE) ×1 IMPLANT

## 2020-09-14 NOTE — Anesthesia Preprocedure Evaluation (Signed)
Anesthesia Evaluation  Patient identified by MRN, date of birth, ID band Patient awake    Reviewed: Allergy & Precautions, H&P , NPO status , Patient's Chart, lab work & pertinent test results, reviewed documented beta blocker date and time   Airway Mallampati: II  TM Distance: >3 FB Neck ROM: full    Dental  (+) Teeth Intact, Poor Dentition   Pulmonary neg pulmonary ROS, asthma , former smoker,    Pulmonary exam normal        Cardiovascular Exercise Tolerance: Good negative cardio ROS Normal cardiovascular exam Rhythm:regular Rate:Normal     Neuro/Psych  Headaches, PSYCHIATRIC DISORDERS Bipolar Disorder negative neurological ROS  negative psych ROS   GI/Hepatic negative GI ROS, Neg liver ROS, GERD  Medicated,  Endo/Other  negative endocrine ROS  Renal/GU Renal diseasenegative Renal ROS  negative genitourinary   Musculoskeletal   Abdominal   Peds  Hematology negative hematology ROS (+)   Anesthesia Other Findings Past Medical History: No date: Asthma     Comment:  childhood asthma No date: Bipolar disorder (HCC) No date: GERD (gastroesophageal reflux disease) No date: Headache     Comment:  Migraines No date: History of kidney stones No date: History of stomach ulcers No date: Kidney calculus No date: Kidney stones No date: Osteoarthritis No date: Reactive hypoglycemia Past Surgical History: No date: ABDOMINAL HYSTERECTOMY No date: CAPSULE ENDOSCOPY No date: COLONOSCOPY No date: cyst removed on wrist 12/02/2017: CYSTOSCOPY; N/A     Comment:  Procedure: CYSTOSCOPY;  Surgeon: [name], MD;                Location: [facility] ORS;  Service: Gynecology;  Laterality:               N/A; No date: ESOPHAGOGASTRODUODENOSCOPY 10/01/2016: ESOPHAGOGASTRODUODENOSCOPY (EGD) WITH PROPOFOL; N/A     Comment:  Procedure: ESOPHAGOGASTRODUODENOSCOPY (EGD) WITH               PROPOFOL;  Surgeon: Christena Deem, MD;   Location:               [facility] ENDOSCOPY;  Service: Endoscopy;  Laterality: N/A; 12/02/2017: LAPAROSCOPIC BILATERAL SALPINGECTOMY; Bilateral     Comment:  Procedure: LAPAROSCOPIC BILATERAL SALPINGECTOMY;                Surgeon: [name], MD;  Location: [facility] ORS;                Service: Gynecology;  Laterality: Bilateral; 12/02/2017: LAPAROSCOPIC HYSTERECTOMY; N/A     Comment:  Procedure: HYSTERECTOMY TOTAL LAPAROSCOPIC;  Surgeon:               [name], MD;  Location: [facility] ORS;  Service:               Gynecology;  Laterality: N/A; No date: MOUTH SURGERY BMI    Body Mass Index: 26.57 kg/m     Reproductive/Obstetrics negative OB ROS                             Anesthesia Physical Anesthesia Plan  ASA: II  Anesthesia Plan: General ETT   Post-op Pain Management:    Induction:   PONV Risk Score and Plan:   Airway Management Planned:   Additional Equipment:   Intra-op Plan:   Post-operative Plan:   Informed Consent: I have reviewed the patients History and Physical, chart, labs and discussed the procedure including the risks, benefits and alternatives for the  proposed anesthesia with the patient or authorized representative who has indicated his/her understanding and acceptance.     Dental Advisory Given  Plan Discussed with: CRNA  Anesthesia Plan Comments:         Anesthesia Quick Evaluation

## 2020-09-14 NOTE — OR Nursing (Signed)
Dr Pernell Dupre notified of labs from 08/17/2020 no new orders at this time.

## 2020-09-14 NOTE — Transfer of Care (Signed)
Immediate Anesthesia Transfer of Care Note  Patient: Olivia Walter  Procedure(s) Performed: XI ROBOTIC ASSISTED LAPAROSCOPIC CHOLECYSTECTOMY (N/A Abdomen)  Patient Location: PACU  Anesthesia Type:General  Level of Consciousness: drowsy  Airway & Oxygen Therapy: Patient Spontanous Breathing and Patient connected to face mask oxygen  Post-op Assessment: Report given to RN  Post vital signs: stable  Last Vitals:  Vitals Value Taken Time  BP    Temp    Pulse    Resp    SpO2      Last Pain:  Vitals:   09/14/20 1055  TempSrc: Temporal  PainSc: 6          Complications: No complications documented.

## 2020-09-14 NOTE — Discharge Instructions (Signed)

## 2020-09-14 NOTE — Interval H&P Note (Signed)
History and Physical Interval Note:  09/14/2020 1:20 PM  Olivia Walter  has presented today for surgery, with the diagnosis of K80.20 Cholelithiasis w/o cholecystitis.  The various methods of treatment have been discussed with the patient and family. After consideration of risks, benefits and other options for treatment, the patient has consented to  Procedure(s): XI ROBOTIC ASSISTED LAPAROSCOPIC CHOLECYSTECTOMY (N/A) as a surgical intervention.  The patient's history has been reviewed, patient examined, no change in status, stable for surgery.  I have reviewed the patient's chart and labs.  Questions were answered to the patient's satisfaction.     Carolan Shiver

## 2020-09-14 NOTE — Anesthesia Procedure Notes (Signed)
Procedure Name: Intubation Performed by: Kelton Pillar, CRNA Pre-anesthesia Checklist: Patient identified, Emergency Drugs available, Suction available and Patient being monitored Patient Re-evaluated:Patient Re-evaluated prior to induction Oxygen Delivery Method: Circle system utilized Preoxygenation: Pre-oxygenation with 100% oxygen Induction Type: IV induction Ventilation: Mask ventilation without difficulty Laryngoscope Size: Mac and 3 Grade View: Grade I Tube type: Oral Tube size: 6.5 mm Number of attempts: 1 Airway Equipment and Method: Stylet and Oral airway Placement Confirmation: ETT inserted through vocal cords under direct vision,  positive ETCO2,  breath sounds checked- equal and bilateral and CO2 detector Secured at: 21 cm Tube secured with: Tape Dental Injury: Teeth and Oropharynx as per pre-operative assessment

## 2020-09-15 NOTE — Anesthesia Postprocedure Evaluation (Signed)
Anesthesia Post Note  Patient: Olivia Walter  Procedure(s) Performed: XI ROBOTIC ASSISTED LAPAROSCOPIC CHOLECYSTECTOMY (N/A Abdomen)  Patient location during evaluation: PACU Anesthesia Type: General Level of consciousness: awake and alert Pain management: pain level controlled Vital Signs Assessment: post-procedure vital signs reviewed and stable Respiratory status: spontaneous breathing, nonlabored ventilation, respiratory function stable and patient connected to nasal cannula oxygen Cardiovascular status: blood pressure returned to baseline and stable Postop Assessment: no apparent nausea or vomiting Anesthetic complications: no   No complications documented.   Last Vitals:  Vitals:   09/14/20 1630 09/14/20 1657  BP:  105/68  Pulse: 88 84  Resp: 14 15  Temp:  36.9 C  SpO2: 96% 100%    Last Pain:  Vitals:   09/14/20 1657  TempSrc:   PainSc: 5                  Yevette Edwards

## 2020-09-15 NOTE — Op Note (Signed)
Preoperative diagnosis: Symptomatic cholelithiasis   Postoperative diagnosis: Symptomatic cholelithiasis   Procedure: Robotic Assisted Laparoscopic Cholecystectomy.   Anesthesia: GETA   Surgeon: Dr. Hazle Quant  Wound Classification: Clean Contaminated  Indications: Patient is a 34 y.o. female developed right upper quadrant pain, nausea and on workup was found to have cholelithiasis with a normal common duct Robotic Assisted Laparoscopic cholecystectomy was elected.  Findings:  Critical view of safety achieved Cystic duct and artery identified, ligated and divided Adequate hemostasis  Description of procedure: The patient was placed on the operating table in the supine position. General anesthesia was induced. A time-out was completed verifying correct patient, procedure, site, positioning, and implant(s) and/or special equipment prior to beginning this procedure. An orogastric tube was placed. The abdomen was prepped and draped in the usual sterile fashion.  An incision was made in a natural skin line below the umbilicus.  The fascia was elevated and the Veress needle inserted. Proper position was confirmed by aspiration and saline meniscus test.  The abdomen was insufflated with carbon dioxide to a pressure of 15 mmHg. The patient tolerated insufflation well. A 8-mm trocar was then inserted in optiview fashion.  The laparoscope was inserted and the abdomen inspected. No injuries from initial trocar placement were noted. Additional trocars were then inserted in the following locations: an 8-mm trocar in the left lateral abdomen, and another two 8-mm trocars to the right side of the abdomen 5 cm appart. The umbilical trocar was changed to a 12 mm trocar all under direct visualization. The abdomen was inspected and no abnormalities were found. The table was placed in the reverse Trendelenburg position with the right side up. The robotic arms were docked and target anatomy identified.  Instrument inserted under direct visualization.  Filmy adhesions between the gallbladder and omentum, duodenum and transverse colon were lysed with electrocautery. The dome of the gallbladder was grasped with a prograsp and retracted over the dome of the liver. The infundibulum was also grasped with an atraumatic grasper and retracted toward the right lower quadrant. This maneuver exposed Calot's triangle. The peritoneum overlying the gallbladder infundibulum was then incised and the cystic duct and cystic artery identified and circumferentially dissected. Critical view of safety reviewed before ligating any structure. Firefly images taken to visualize biliary ducts. The cystic duct and cystic artery were then doubly clipped and divided close to the gallbladder.  The gallbladder was then dissected from its peritoneal attachments by electrocautery. Hemostasis was checked and the gallbladder and contained stones were removed using an endoscopic retrieval bag. The gallbladder was passed off the table as a specimen. The gallbladder fossa was copiously irrigated with saline and hemostasis was obtained. There was no evidence of bleeding from the gallbladder fossa or cystic artery or leakage of the bile from the cystic duct stump. Secondary trocars were removed under direct vision. No bleeding was noted. The robotic arms were undoked. The scope was withdrawn and the umbilical trocar removed. The abdomen was allowed to collapse. The fascia of the 49mm trocar sites was closed with figure-of-eight 0 vicryl sutures. The skin was closed with subcuticular sutures of 4-0 monocryl and topical skin adhesive. The orogastric tube was removed.  The patient tolerated the procedure well and was taken to the postanesthesia care unit in stable condition.   Specimen: Gallbladder  Complications: None  EBL: 5 mL

## 2020-09-16 LAB — SURGICAL PATHOLOGY

## 2021-05-24 ENCOUNTER — Emergency Department: Payer: BC Managed Care – PPO

## 2021-05-24 ENCOUNTER — Encounter: Payer: Self-pay | Admitting: Emergency Medicine

## 2021-05-24 ENCOUNTER — Emergency Department
Admission: EM | Admit: 2021-05-24 | Discharge: 2021-05-24 | Disposition: A | Discharge status: Home / Self Care | Payer: BC Managed Care – PPO | Attending: Emergency Medicine | Admitting: Emergency Medicine

## 2021-05-24 ENCOUNTER — Other Ambulatory Visit: Payer: Self-pay

## 2021-05-24 DIAGNOSIS — U071 COVID-19: Secondary | ICD-10-CM | POA: Diagnosis not present

## 2021-05-24 DIAGNOSIS — J45909 Unspecified asthma, uncomplicated: Secondary | ICD-10-CM | POA: Insufficient documentation

## 2021-05-24 DIAGNOSIS — R079 Chest pain, unspecified: Secondary | ICD-10-CM | POA: Diagnosis present

## 2021-05-24 DIAGNOSIS — Z87891 Personal history of nicotine dependence: Secondary | ICD-10-CM | POA: Insufficient documentation

## 2021-05-24 LAB — BASIC METABOLIC PANEL
Anion gap: 9 (ref 5–15)
BUN: 5 mg/dL — ABNORMAL LOW (ref 6–20)
CO2: 21 mmol/L — ABNORMAL LOW (ref 22–32)
Calcium: 8.7 mg/dL — ABNORMAL LOW (ref 8.9–10.3)
Chloride: 104 mmol/L (ref 98–111)
Creatinine, Ser: 0.91 mg/dL (ref 0.44–1.00)
GFR, Estimated: >60 mL/min (ref >60)
Glucose, Bld: 139 mg/dL — ABNORMAL HIGH (ref 70–99)
Potassium: 3 mmol/L — ABNORMAL LOW (ref 3.5–5.1)
Sodium: 134 mmol/L — ABNORMAL LOW (ref 135–145)

## 2021-05-24 LAB — RESP PANEL BY RT-PCR (FLU A&B, COVID) ARPGX2
Influenza A by PCR: NEGATIVE
Influenza B by PCR: NEGATIVE
SARS Coronavirus 2 by RT PCR: POSITIVE — AB

## 2021-05-24 LAB — URINALYSIS, COMPLETE (UACMP) WITH MICROSCOPIC
Bilirubin Urine: NEGATIVE
Glucose, UA: NEGATIVE mg/dL
Ketones, ur: 20 mg/dL — AB
Leukocytes,Ua: NEGATIVE
Nitrite: NEGATIVE
Protein, ur: NEGATIVE mg/dL
Specific Gravity, Urine: 1.028 (ref 1.005–1.030)
pH: 5 (ref 5.0–8.0)

## 2021-05-24 LAB — CBC
HCT: 40.9 % (ref 36.0–46.0)
Hemoglobin: 14.7 g/dL (ref 12.0–15.0)
MCH: 31.5 pg (ref 26.0–34.0)
MCHC: 35.9 g/dL (ref 30.0–36.0)
MCV: 87.6 fL (ref 80.0–100.0)
Platelets: 253 10*3/uL (ref 150–400)
RBC: 4.67 MIL/uL (ref 3.87–5.11)
RDW: 12.6 % (ref 11.5–15.5)
WBC: 4.4 10*3/uL (ref 4.0–10.5)
nRBC: 0 % (ref 0.0–0.2)

## 2021-05-24 LAB — PREGNANCY, URINE: Preg Test, Ur: NEGATIVE

## 2021-05-24 LAB — TROPONIN I (HIGH SENSITIVITY)
Troponin I (High Sensitivity): <2 ng/L (ref <18)
Troponin I (High Sensitivity): 3 ng/L (ref <18)

## 2021-05-24 MED ORDER — KETOROLAC TROMETHAMINE 10 MG PO TABS: 10.0000 mg | ORAL_TABLET | Freq: Four times a day (QID) | ORAL | 0 refills | Status: AC | PRN

## 2021-05-24 MED ORDER — POTASSIUM CHLORIDE CRYS ER 20 MEQ PO TBCR
40.0000 meq | EXTENDED_RELEASE_TABLET | Freq: Once | ORAL | Status: AC
  Administered 2021-05-24: 40 meq via ORAL
  Filled 2021-05-24: qty 2

## 2021-05-24 MED ORDER — KETOROLAC TROMETHAMINE 30 MG/ML IJ SOLN
30.0000 mg | Freq: Once | INTRAMUSCULAR | Status: AC
  Administered 2021-05-24: 30 mg via INTRAMUSCULAR
  Filled 2021-05-24: qty 1

## 2021-05-24 MED ORDER — POTASSIUM CHLORIDE CRYS ER 20 MEQ PO TBCR: 20.0000 meq | EXTENDED_RELEASE_TABLET | Freq: Two times a day (BID) | ORAL | 0 refills | Status: AC

## 2021-05-24 NOTE — ED Triage Notes (Signed)
Pt reports that she developed chest pain Sunday, it sarted below her ribs and has radiated to both sides of her upper chest. She has a little nausea no vomiting. She has had diaphoresis, no SHOB. She reports that it feels like pleurisy, but worse.

## 2021-05-24 NOTE — ED Provider Notes (Signed)
[facility]  ____________________________________________  Time seen: Approximately 8:57 PM  I have reviewed the triage vital signs and the nursing notes.   HISTORY  Chief Complaint Chest Pain   Historian Patient     HPI Olivia Walter is a 35 y.o. female presents to the emergency department with anterior chest pain that started on Sunday.  Patient reports that it occurs below her breast bilaterally and seems to radiate along her anterior chest.  She states that she has experienced similar symptoms when she has had pleurisy.  She reports that she has had fever, sporadic cough, rhinorrhea, nasal congestion and body aches.  She has not been tested for COVID recently.  Denies history of DVT or PE.  Denies swelling of the lower extremities.  No vomiting or diarrhea.  No recent travel or daily smoking.  No other alleviating measures have been attempted.   Past Medical History:  Diagnosis Date   Asthma    childhood asthma   Bipolar disorder (HCC)    GERD (gastroesophageal reflux disease)    Headache    Migraines   History of kidney stones    History of stomach ulcers    Kidney calculus    Kidney stones    Osteoarthritis    Reactive hypoglycemia      Immunizations up to date:  Yes.     Past Medical History:  Diagnosis Date   Asthma    childhood asthma   Bipolar disorder (HCC)    GERD (gastroesophageal reflux disease)    Headache    Migraines   History of kidney stones    History of stomach ulcers    Kidney calculus    Kidney stones    Osteoarthritis    Reactive hypoglycemia     There are no problems to display for this patient.   Past Surgical History:  Procedure Laterality Date   ABDOMINAL HYSTERECTOMY     CAPSULE ENDOSCOPY     COLONOSCOPY     cyst removed on wrist     CYSTOSCOPY N/A 12/02/2017   Procedure: CYSTOSCOPY;  Surgeon: [name], MD;  Location: [facility] ORS;  Service: Gynecology;  Laterality: N/A;    ESOPHAGOGASTRODUODENOSCOPY     ESOPHAGOGASTRODUODENOSCOPY (EGD) WITH PROPOFOL N/A 10/01/2016   Procedure: ESOPHAGOGASTRODUODENOSCOPY (EGD) WITH PROPOFOL;  Surgeon: Christena Deem, MD;  Location: [facility] ENDOSCOPY;  Service: Endoscopy;  Laterality: N/A;   LAPAROSCOPIC BILATERAL SALPINGECTOMY Bilateral 12/02/2017   Procedure: LAPAROSCOPIC BILATERAL SALPINGECTOMY;  Surgeon: [name], MD;  Location: [facility] ORS;  Service: Gynecology;  Laterality: Bilateral;   LAPAROSCOPIC HYSTERECTOMY N/A 12/02/2017   Procedure: HYSTERECTOMY TOTAL LAPAROSCOPIC;  Surgeon: [name], MD;  Location: [facility] ORS;  Service: Gynecology;  Laterality: N/A;   MOUTH SURGERY      Prior to Admission medications   Medication Sig Start Date End Date Taking? Authorizing Provider  ketorolac (TORADOL) 10 MG tablet Take 1 tablet (10 mg total) by mouth every 6 (six) hours as needed for up to 5 days. 05/24/21 05/29/21 Yes Pia Mau M, PA-C  potassium chloride SA (KLOR-CON) 20 MEQ tablet Take 1 tablet (20 mEq total) by mouth 2 (two) times daily for 5 days. 05/24/21 05/29/21 Yes Pia Mau M, PA-C  acetaminophen (TYLENOL) 500 MG tablet Take 1,000 mg by mouth every 6 (six) hours as needed for fever.    [provider]  aspirin-acetaminophen-caffeine (EXCEDRIN MIGRAINE) [phone] MG tablet Take 2 tablets by mouth every 6 (six) hours as needed for headache.     [provider]  cyclobenzaprine (FLEXERIL) 5 MG tablet Take 1-2 tablets (5-10 mg total) by mouth 3 (three) times daily as needed for muscle spasms. Patient not taking: Reported on 09/13/2020 04/28/20   Evon Slack, PA-C  medroxyPROGESTERone (DEPO-PROVERA) 150 MG/ML injection Inject 150 mg into the muscle every 3 (three) months. 07/01/20   [provider]  naproxen (NAPROSYN) 500 MG tablet Take 1 tablet (500 mg total) by mouth 2 (two) times daily with a meal. Patient not taking: Reported on 09/13/2020 10/01/19   Jene Every, MD  omeprazole  (PRILOSEC) 40 MG capsule Take 40 mg by mouth once. 06/16/20   [provider]  SUMAtriptan (IMITREX) 50 MG tablet Take 50 mg by mouth every 2 (two) hours as needed for migraine. May repeat in 2 hours if headache persists or recurs.    [provider]    Allergies Bee venom, Penicillins, Bactrim [sulfamethoxazole-trimethoprim], Clindamycin/lincomycin, and Tape  Family History  Problem Relation Age of Onset   Diabetes Mother    Congestive Heart Failure Mother    Hypertension Mother    Hypertension Father    Prostate cancer Neg Hx    Kidney cancer Neg Hx    Bladder Cancer Neg Hx     Social History Social History   Tobacco Use   Smoking status: Former    Packs/day: 1.00    Pack years: 0.00    Types: Cigarettes    Quit date: 10/2016    Years since quitting: 4.5   Smokeless tobacco: Never   Tobacco comments:    Quit in Dec 2017  Vaping Use   Vaping Use: Every day  Substance Use Topics   Alcohol use: No   Drug use: No     Review of Systems  Constitutional: No fever/chills Eyes:  No discharge ENT: No upper respiratory complaints. Respiratory: no cough. No SOB/ use of accessory muscles to breath Gastrointestinal:   No nausea, no vomiting.  No diarrhea.  No constipation. Musculoskeletal: Negative for musculoskeletal pain. Cardiac: Patient has anterior chest pain.  Skin: Negative for rash, abrasions, lacerations, ecchymosis. ____________________________________________   PHYSICAL EXAM:  VITAL SIGNS: ED Triage Vitals  Enc Vitals Group     BP 05/24/21 1717 105/90     Pulse Rate 05/24/21 1717 94     Resp 05/24/21 1717 16     Temp 05/24/21 1717 98.6 F (37 C)     Temp Source 05/24/21 1717 Oral     SpO2 05/24/21 1717 97 %     Weight 05/24/21 1713 175 lb (79.4 kg)     Height 05/24/21 1713 5\' 3"  (1.6 m)     Head Circumference --      Peak Flow --      Pain Score 05/24/21 1713 7     Pain Loc --      Pain Edu? --      Excl. in GC? --       Constitutional: Alert and oriented. Well appearing and in no acute distress. Eyes: Conjunctivae are normal. PERRL. EOMI. Head: Atraumatic. ENT:      Nose: No congestion/rhinnorhea.      Mouth/Throat: Mucous membranes are moist.  Neck: No stridor.  No cervical spine tenderness to palpation. Cardiovascular: Normal rate, regular rhythm. Normal S1 and S2.  Good peripheral circulation. Respiratory: Normal respiratory effort without tachypnea or retractions. Lungs CTAB. Good air entry to the bases with no decreased or absent breath sounds Gastrointestinal: Bowel sounds x 4 quadrants. Soft and nontender to palpation. No  guarding or rigidity. No distention. Musculoskeletal: Full range of motion to all extremities. No obvious deformities noted Neurologic:  Normal for age. No gross focal neurologic deficits are appreciated.  Skin:  Skin is warm, dry and intact. No rash noted. Psychiatric: Mood and affect are normal for age. Speech and behavior are normal.   ____________________________________________   LABS (all labs ordered are listed, but only abnormal results are displayed)  Labs Reviewed  RESP PANEL BY RT-PCR (FLU A&B, COVID) ARPGX2 - Abnormal; Notable for the following components:      Result Value   SARS Coronavirus 2 by RT PCR POSITIVE (*)    All other components within normal limits  BASIC METABOLIC PANEL - Abnormal; Notable for the following components:   Sodium 134 (*)    Potassium 3.0 (*)    CO2 21 (*)    Glucose, Bld 139 (*)    BUN 5 (*)    Calcium 8.7 (*)    All other components within normal limits  URINALYSIS, COMPLETE (UACMP) WITH MICROSCOPIC - Abnormal; Notable for the following components:   Color, Urine YELLOW (*)    APPearance HAZY (*)    Hgb urine dipstick SMALL (*)    Ketones, ur 20 (*)    Bacteria, UA RARE (*)    All other components within normal limits  CBC  PREGNANCY, URINE  TROPONIN I (HIGH SENSITIVITY)  TROPONIN I (HIGH SENSITIVITY)    ____________________________________________  EKG   ____________________________________________  RADIOLOGY Geraldo PitterI, Melea Prezioso M Shanica Castellanos, personally viewed and evaluated these images (plain radiographs) as part of my medical decision making, as well as reviewing the written report by the radiologist.  DG Chest 2 View  Result Date: 05/24/2021 CLINICAL DATA:  35 year old female with chest pain. EXAM: CHEST - 2 VIEW COMPARISON:  Chest radiograph dated 02/06/2018 FINDINGS: Persistent 3 cm opacity in the retrosternal clear space, seen on the lateral view. This was present on the prior radiograph of 2019 and of indeterminate etiology. No new consolidation. There is no pleural effusion pneumothorax. The cardiac silhouette is within normal limits. No acute osseous pathology. IMPRESSION: No active cardiopulmonary disease. Electronically Signed   By: Elgie CollardArash  Radparvar M.D.   On: 05/24/2021 18:13    ____________________________________________    PROCEDURES  Procedure(s) performed:     Procedures     Medications  potassium chloride SA (KLOR-CON) CR tablet 40 mEq (40 mEq Oral Given 05/24/21 2102)  ketorolac (TORADOL) 30 MG/ML injection 30 mg (30 mg Intramuscular Given 05/24/21 2058)     ____________________________________________   INITIAL IMPRESSION / ASSESSMENT AND PLAN / ED COURSE  Pertinent labs & imaging results that were available during my care of the patient were reviewed by me and considered in my medical decision making (see chart for details).       Assessment and plan Chest pain 35 year old female presents to the emergency department with anterior chest pain that started on Sunday.  Vital signs were reassuring at triage.  On physical exam, patient was alert, active and nontoxic-appearing with no increased work of breathing.  Chest x-ray showed no consolidations, opacities or infiltrates to suggest pneumonia.  Patient's BMP showed mild hypokalemia.  CBC was  reassuring.  Patient's COVID-19 test was positive.  Both sets of troponin were within reference range.  EKG indicates normal sinus rhythm without ST segment elevation or other apparent arrhythmia.   ____________________________________________  FINAL CLINICAL IMPRESSION(S) / ED DIAGNOSES  Final diagnoses:  Chest pain, unspecified type      NEW MEDICATIONS  STARTED DURING THIS VISIT:  ED Discharge Orders          Ordered    potassium chloride SA (KLOR-CON) 20 MEQ tablet  2 times daily        05/24/21 2314    ketorolac (TORADOL) 10 MG tablet  Every 6 hours PRN        05/24/21 2314                This chart was dictated using voice recognition software/Dragon. Despite best efforts to proofread, errors can occur which can change the meaning. Any change was purely unintentional.     Orvil Feil, PA-C 05/24/21 2322    Shaune Pollack, MD 05/28/21 1530

## 2021-05-24 NOTE — Discharge Instructions (Addendum)
You can take Toradol up to four times a day for five days. Take 40 mg of potassium once daily for the next five days.

## 2021-11-26 ENCOUNTER — Other Ambulatory Visit: Payer: Self-pay

## 2021-11-26 DIAGNOSIS — E876 Hypokalemia: Secondary | ICD-10-CM | POA: Insufficient documentation

## 2021-11-26 DIAGNOSIS — R519 Headache, unspecified: Secondary | ICD-10-CM | POA: Insufficient documentation

## 2021-11-26 DIAGNOSIS — Z5321 Procedure and treatment not carried out due to patient leaving prior to being seen by health care provider: Secondary | ICD-10-CM | POA: Insufficient documentation

## 2021-11-26 LAB — BASIC METABOLIC PANEL
Anion gap: 9 (ref 5–15)
BUN: 6 mg/dL (ref 6–20)
CO2: 28 mmol/L (ref 22–32)
Calcium: 8.9 mg/dL (ref 8.9–10.3)
Chloride: 97 mmol/L — ABNORMAL LOW (ref 98–111)
Creatinine, Ser: 0.64 mg/dL (ref 0.44–1.00)
GFR, Estimated: >60 mL/min (ref >60)
Glucose, Bld: 129 mg/dL — ABNORMAL HIGH (ref 70–99)
Potassium: 2.4 mmol/L — CL (ref 3.5–5.1)
Sodium: 134 mmol/L — ABNORMAL LOW (ref 135–145)

## 2021-11-26 LAB — CBC
HCT: 44.9 % (ref 36.0–46.0)
Hemoglobin: 16.1 g/dL — ABNORMAL HIGH (ref 12.0–15.0)
MCH: 31.8 pg (ref 26.0–34.0)
MCHC: 35.9 g/dL (ref 30.0–36.0)
MCV: 88.7 fL (ref 80.0–100.0)
Platelets: 365 10*3/uL (ref 150–400)
RBC: 5.06 MIL/uL (ref 3.87–5.11)
RDW: 14.7 % (ref 11.5–15.5)
WBC: 10.5 10*3/uL (ref 4.0–10.5)
nRBC: 0 % (ref 0.0–0.2)

## 2021-11-26 LAB — MAGNESIUM: Magnesium: 1.8 mg/dL (ref 1.7–2.4)

## 2021-11-26 MED ORDER — POTASSIUM CHLORIDE 20 MEQ PO PACK: 60.0000 meq | PACK | Freq: Once | ORAL | Status: DC

## 2021-11-26 NOTE — ED Triage Notes (Signed)
Pt presents to ER c/o headache.  Pt states she has hx of migraines.  Pt states pain is in frontal area of head.  Pt states she has sensitivity to light and sound, and hurts when she moves her head.  Pt states she took Imitrex yesterday which helped but ran out today.

## 2021-11-27 ENCOUNTER — Emergency Department
Admission: EM | Admit: 2021-11-27 | Discharge: 2021-11-27 | Disposition: A | Discharge status: Home / Self Care | Payer: BC Managed Care – PPO | Attending: Emergency Medicine | Admitting: Emergency Medicine

## 2021-11-27 NOTE — ED Provider Notes (Signed)
Patient was seen in triage and had labs ordered.  I see that her potassium was 2.4 and I was made aware of this by nursing.  Unfortunately patient left prior to being seen in prior to be getting any potassium supplementation.  I was able to get in touch with her by telephone notified of her of her dangerously low potassium and advised that she come back to the emergency department for treatment.  I explained the risk of arrhythmia which could result in death if not treated.  Patient unsure if she is going to come back tonight may try oral migraine medications and see if she can get her headache under control.  I did advise that she if she is not going to come back to the emergency department that she should really follow-up with a primary care provider and try to supplement her potassium with potassium rich foods.   Olivia Hacking, MD 11/27/21 [name]

## 2022-01-29 ENCOUNTER — Ambulatory Visit
Admission: EM | Admit: 2022-01-29 | Discharge: 2022-01-29 | Disposition: A | Discharge status: Home / Self Care | Payer: Self-pay | Attending: Emergency Medicine | Admitting: Emergency Medicine

## 2022-01-29 ENCOUNTER — Ambulatory Visit (INDEPENDENT_AMBULATORY_CARE_PROVIDER_SITE_OTHER): Payer: Self-pay

## 2022-01-29 ENCOUNTER — Encounter: Payer: Self-pay | Admitting: Emergency Medicine

## 2022-01-29 DIAGNOSIS — S9032XA Contusion of left foot, initial encounter: Secondary | ICD-10-CM

## 2022-01-29 DIAGNOSIS — M79672 Pain in left foot: Secondary | ICD-10-CM

## 2022-01-29 NOTE — Discharge Instructions (Addendum)
Your xray is negative.  Take the ibuprofen as directed.  Rest and elevate your foot.  Apply ice packs 2-3 times a day for up to 20 minutes each.  Buddy tape your toes as needed for comfort.   ? ?Follow up with an orthopedist if your symptoms are not improving.   ? ?
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
## 2022-10-24 ENCOUNTER — Emergency Department
Admission: EM | Admit: 2022-10-24 | Discharge: 2022-10-24 | Disposition: A | Discharge status: Home / Self Care | Payer: Self-pay | Attending: Emergency Medicine | Admitting: Emergency Medicine

## 2022-10-24 ENCOUNTER — Encounter: Payer: Self-pay | Admitting: Emergency Medicine

## 2022-10-24 DIAGNOSIS — E876 Hypokalemia: Secondary | ICD-10-CM | POA: Insufficient documentation

## 2022-10-24 DIAGNOSIS — G43809 Other migraine, not intractable, without status migrainosus: Secondary | ICD-10-CM | POA: Insufficient documentation

## 2022-10-24 LAB — CBC WITH DIFFERENTIAL/PLATELET
Abs Immature Granulocytes: 0.06 10*3/uL (ref 0.00–0.07)
Basophils Absolute: 0 10*3/uL (ref 0.0–0.1)
Basophils Relative: 0 %
Eosinophils Absolute: 0 10*3/uL (ref 0.0–0.5)
Eosinophils Relative: 0 %
HCT: 43 % (ref 36.0–46.0)
Hemoglobin: 15.2 g/dL — ABNORMAL HIGH (ref 12.0–15.0)
Immature Granulocytes: 1 %
Lymphocytes Relative: 14 %
Lymphs Abs: 1.8 10*3/uL (ref 0.7–4.0)
MCH: 31.3 pg (ref 26.0–34.0)
MCHC: 35.3 g/dL (ref 30.0–36.0)
MCV: 88.7 fL (ref 80.0–100.0)
Monocytes Absolute: 0.5 10*3/uL (ref 0.1–1.0)
Monocytes Relative: 4 %
Neutro Abs: 10.3 10*3/uL — ABNORMAL HIGH (ref 1.7–7.7)
Neutrophils Relative %: 81 %
Platelets: 379 10*3/uL (ref 150–400)
RBC: 4.85 MIL/uL (ref 3.87–5.11)
RDW: 13.1 % (ref 11.5–15.5)
WBC: 12.7 10*3/uL — ABNORMAL HIGH (ref 4.0–10.5)
nRBC: 0 % (ref 0.0–0.2)

## 2022-10-24 LAB — PREGNANCY, URINE: Preg Test, Ur: NEGATIVE

## 2022-10-24 LAB — URINALYSIS, ROUTINE W REFLEX MICROSCOPIC
Bilirubin Urine: NEGATIVE
Glucose, UA: NEGATIVE mg/dL
Ketones, ur: 80 mg/dL — AB
Leukocytes,Ua: NEGATIVE
Nitrite: NEGATIVE
Protein, ur: NEGATIVE mg/dL
Specific Gravity, Urine: 1.012 (ref 1.005–1.030)
pH: 6 (ref 5.0–8.0)

## 2022-10-24 LAB — COMPREHENSIVE METABOLIC PANEL
ALT: 24 U/L (ref 0–44)
AST: 19 U/L (ref 15–41)
Albumin: 4.4 g/dL (ref 3.5–5.0)
Alkaline Phosphatase: 75 U/L (ref 38–126)
Anion gap: 8 (ref 5–15)
BUN: 6 mg/dL (ref 6–20)
CO2: 31 mmol/L (ref 22–32)
Calcium: 8.9 mg/dL (ref 8.9–10.3)
Chloride: 103 mmol/L (ref 98–111)
Creatinine, Ser: 0.67 mg/dL (ref 0.44–1.00)
GFR, Estimated: >60 mL/min (ref >60)
Glucose, Bld: 107 mg/dL — ABNORMAL HIGH (ref 70–99)
Potassium: 2.8 mmol/L — ABNORMAL LOW (ref 3.5–5.1)
Sodium: 142 mmol/L (ref 135–145)
Total Bilirubin: 0.9 mg/dL (ref 0.3–1.2)
Total Protein: 7.7 g/dL (ref 6.5–8.1)

## 2022-10-24 MED ORDER — METOCLOPRAMIDE HCL 5 MG/ML IJ SOLN
10.0000 mg | Freq: Once | INTRAMUSCULAR | Status: AC
  Administered 2022-10-24: 10 mg via INTRAVENOUS
  Filled 2022-10-24: qty 2

## 2022-10-24 MED ORDER — POTASSIUM CHLORIDE 10 MEQ/100ML IV SOLN
10.0000 meq | Freq: Once | INTRAVENOUS | Status: AC
  Administered 2022-10-24: 10 meq via INTRAVENOUS
  Filled 2022-10-24: qty 100

## 2022-10-24 MED ORDER — SODIUM CHLORIDE 0.9 % IV BOLUS
1000.0000 mL | Freq: Once | INTRAVENOUS | Status: AC
  Administered 2022-10-24: 1000 mL via INTRAVENOUS

## 2022-10-24 MED ORDER — KETOROLAC TROMETHAMINE 15 MG/ML IJ SOLN
15.0000 mg | Freq: Once | INTRAMUSCULAR | Status: AC
  Administered 2022-10-24: 15 mg via INTRAVENOUS
  Filled 2022-10-24: qty 1

## 2022-10-24 NOTE — Discharge Instructions (Signed)
Please follow-up with your outpatient provider.  Please return for any new, worsening, or change in symptoms or other concerns. 

## 2022-10-24 NOTE — ED Provider Notes (Signed)
[facility] Provider Note    Event Date/Time   First MD Initiated Contact with Patient 10/24/22 2114     (approximate)   History   Migraine   HPI  Olivia Walter is a 36 y.o. female with a past medical history of migraine headaches who presents today for evaluation of migraine.  Patient reports that her symptoms began 3 to 4 days ago.  She has been taking her Imitrex without improvement of her symptoms.  She reports that her headache is left-sided and is the same as her previous migraine headaches.  She reports that she has had nausea and vomiting.  She denies vision changes.  No fevers.  No neck pain or stiffness.  There are no problems to display for this patient.         Physical Exam   Triage Vital Signs: ED Triage Vitals [10/24/22 1912]  Enc Vitals Group     BP 113/81     Pulse Rate 78     Resp 18     Temp 98.5 F (36.9 C)     Temp Source Oral     SpO2 96 %     Weight 130 lb (59 kg)     Height 5\' 3"  (1.6 m)     Head Circumference      Peak Flow      Pain Score      Pain Loc      Pain Edu?      Excl. in GC?     Most recent vital signs: Vitals:   10/24/22 1912  BP: 113/81  Pulse: 78  Resp: 18  Temp: 98.5 F (36.9 C)  SpO2: 96%    Physical Exam Vitals and nursing note reviewed.  Constitutional:      General: Awake and alert. No acute distress.    Appearance: Normal appearance. The patient is normal weight.  HENT:     Head: Normocephalic and atraumatic.     Mouth: Mucous membranes are moist.  Eyes:     General: PERRL. Normal EOMs        Right eye: No discharge.        Left eye: No discharge.     Conjunctiva/sclera: Conjunctivae normal.  Cardiovascular:     Rate and Rhythm: Normal rate and regular rhythm.     Pulses: Normal pulses.     Heart sounds: Normal heart sounds Pulmonary:     Effort: Pulmonary effort is normal. No respiratory distress.     Breath sounds: Normal breath sounds.  Abdominal:     Abdomen  is soft. There is no abdominal tenderness. No rebound or guarding. No distention. Musculoskeletal:        General: No swelling. Normal range of motion.     Cervical back: Normal range of motion and neck supple.  Skin:    General: Skin is warm and dry.     Capillary Refill: Capillary refill takes less than 2 seconds.     Findings: No rash.  Neurological:     Mental Status: The patient is awake and alert.  Neurological: GCS 15 alert and oriented x3 Normal speech, no expressive or receptive aphasia or dysarthria Cranial nerves II through XII intact Normal visual fields 5 out of 5 strength in all 4 extremities with intact sensation throughout No extremity drift Normal finger-to-nose testing, no limb or truncal ataxia     ED Results / Procedures / Treatments   Labs (all labs ordered are listed, but  only abnormal results are displayed) Labs Reviewed  CBC WITH DIFFERENTIAL/PLATELET - Abnormal; Notable for the following components:      Result Value   WBC 12.7 (*)    Hemoglobin 15.2 (*)    Neutro Abs 10.3 (*)    All other components within normal limits  COMPREHENSIVE METABOLIC PANEL - Abnormal; Notable for the following components:   Potassium 2.8 (*)    Glucose, Bld 107 (*)    All other components within normal limits  URINALYSIS, ROUTINE W REFLEX MICROSCOPIC - Abnormal; Notable for the following components:   Color, Urine YELLOW (*)    APPearance CLEAR (*)    Hgb urine dipstick MODERATE (*)    Ketones, ur 80 (*)    Bacteria, UA RARE (*)    All other components within normal limits  PREGNANCY, URINE     EKG     RADIOLOGY     PROCEDURES:  Critical Care performed:   Procedures   MEDICATIONS ORDERED IN ED: Medications  ketorolac (TORADOL) 15 MG/ML injection 15 mg (15 mg Intravenous Given 10/24/22 2148)  metoCLOPramide (REGLAN) injection 10 mg (10 mg Intravenous Given 10/24/22 2148)  sodium chloride 0.9 % bolus 1,000 mL (0 mLs Intravenous Stopped 10/24/22  2253)  potassium chloride 10 mEq in 100 mL IVPB (0 mEq Intravenous Stopped 10/24/22 2253)     IMPRESSION / MDM / ASSESSMENT AND PLAN / ED COURSE  I reviewed the triage vital signs and the nursing notes.   Differential diagnosis includes, but is not limited to, migraine headache, tension headache, cluster headache.  Patient presented with a chief concern of a headache.  She reports that she has had many similar headaches in the past.  Her headache was gradual in onset, without history or physical exam findings to suggest encephalopathy; no altered mental status, fever or meningismus, vision changes, vomiting or focal neurological deficit and improved with treatment in the emergency department. Therefore, I have low suspicion for concerning process that would require urgent or emergent imaging or diagnostic/therapeutic procedural intervention such as lumbar puncture. Doubt meningitis as there is no fever, photophobia, neck symptoms, altered mental status. Additionally the patient is not known to be immunocompromised. No history of trauma, doubt subdural or epidural hematoma. No dizziness or other neurologic symptoms so cerebellar infarction or other hemorrhagic stroke are unlikely. Intracranial mass unlikely given that the headache is not getting progressively worse, is not worse in the morning, there are no other neurologic symptoms, and the neurologic exam is grossly normal. Unlikely to be giant cell arteritis as there is no tenderness over temporal artery or vision changes. Doubt CO toxicity as no known exposure and no other family members have a headache. No neck pain and was not sudden onset or associated with movement of the neck and no dizziness,  doubt carotid artery dissection. No occipital tenderness so occipital neuralgia seems less likely.   Labs obtained in triage demonstrate hypokalemia, which has occurred in the setting of vomiting.  However per chart review, patient often has hypokalemia.   This was repleted with IV potassium.  She had mild leukocytosis to 12, though she was actively vomiting for the past couple of days.  She was also given a migraine cocktail with Toradol, Reglan, and normal saline with complete resolution of her headache.  Return precautions discussed, patient to follow-up closely with outpatient provider.   Patient's presentation is most consistent with acute complicated illness / injury requiring diagnostic workup.  Clinical Course as of 10/24/22 2306  Wed Oct 24, 2022  2303 Patient reports that she feels significantly improved and ready for discharge [JP]    Clinical Course User Index [JP] Kenzleigh Sedam, Herb Grays, PA-C     FINAL CLINICAL IMPRESSION(S) / ED DIAGNOSES   Final diagnoses:  Other migraine without status migrainosus, not intractable  Hypokalemia     Rx / DC Orders   ED Discharge Orders     None        Note:  This document was prepared using Dragon voice recognition software and may include unintentional dictation errors.   Keturah Shavers 10/24/22 2306    Minna Antis, MD 10/24/22 2308

## 2022-10-24 NOTE — ED Triage Notes (Signed)
Pt presents via POV with complaints of a migraine for the last 2-3 days. She notes the pain is worse on the right side versus left with associated nausea. Pt notes taking Imitrex PTA without improvement. Denies CP or SOB.

## 2023-03-03 ENCOUNTER — Other Ambulatory Visit: Payer: Self-pay

## 2023-03-03 ENCOUNTER — Emergency Department
Admission: EM | Admit: 2023-03-03 | Discharge: 2023-03-03 | Disposition: A | Discharge status: Home / Self Care | Payer: BLUE CROSS/BLUE SHIELD | Attending: Emergency Medicine | Admitting: Emergency Medicine

## 2023-03-03 DIAGNOSIS — E876 Hypokalemia: Secondary | ICD-10-CM

## 2023-03-03 DIAGNOSIS — G43909 Migraine, unspecified, not intractable, without status migrainosus: Secondary | ICD-10-CM | POA: Diagnosis not present

## 2023-03-03 DIAGNOSIS — R519 Headache, unspecified: Secondary | ICD-10-CM | POA: Diagnosis present

## 2023-03-03 LAB — CBC WITH DIFFERENTIAL/PLATELET
Abs Immature Granulocytes: 0.04 10*3/uL (ref 0.00–0.07)
Basophils Absolute: 0 10*3/uL (ref 0.0–0.1)
Basophils Relative: 0 %
Eosinophils Absolute: 0 10*3/uL (ref 0.0–0.5)
Eosinophils Relative: 0 %
HCT: 42.3 % (ref 36.0–46.0)
Hemoglobin: 15.2 g/dL — ABNORMAL HIGH (ref 12.0–15.0)
Immature Granulocytes: 0 %
Lymphocytes Relative: 21 %
Lymphs Abs: 2.2 10*3/uL (ref 0.7–4.0)
MCH: 31.1 pg (ref 26.0–34.0)
MCHC: 35.9 g/dL (ref 30.0–36.0)
MCV: 86.5 fL (ref 80.0–100.0)
Monocytes Absolute: 0.6 10*3/uL (ref 0.1–1.0)
Monocytes Relative: 5 %
Neutro Abs: 7.7 10*3/uL (ref 1.7–7.7)
Neutrophils Relative %: 74 %
Platelets: 350 10*3/uL (ref 150–400)
RBC: 4.89 MIL/uL (ref 3.87–5.11)
RDW: 12.8 % (ref 11.5–15.5)
WBC: 10.6 10*3/uL — ABNORMAL HIGH (ref 4.0–10.5)
nRBC: 0 % (ref 0.0–0.2)

## 2023-03-03 LAB — COMPREHENSIVE METABOLIC PANEL
ALT: 14 U/L (ref 0–44)
AST: 15 U/L (ref 15–41)
Albumin: 4.2 g/dL (ref 3.5–5.0)
Alkaline Phosphatase: 58 U/L (ref 38–126)
Anion gap: 11 (ref 5–15)
BUN: 8 mg/dL (ref 6–20)
CO2: 29 mmol/L (ref 22–32)
Calcium: 8.9 mg/dL (ref 8.9–10.3)
Chloride: 99 mmol/L (ref 98–111)
Creatinine, Ser: 0.67 mg/dL (ref 0.44–1.00)
GFR, Estimated: >60 mL/min (ref >60)
Glucose, Bld: 103 mg/dL — ABNORMAL HIGH (ref 70–99)
Potassium: 2.5 mmol/L — CL (ref 3.5–5.1)
Sodium: 139 mmol/L (ref 135–145)
Total Bilirubin: 1 mg/dL (ref 0.3–1.2)
Total Protein: 7.2 g/dL (ref 6.5–8.1)

## 2023-03-03 MED ORDER — MAGNESIUM SULFATE 2 GM/50ML IV SOLN
2.0000 g | Freq: Once | INTRAVENOUS | Status: AC
  Administered 2023-03-03: 2 g via INTRAVENOUS
  Filled 2023-03-03: qty 50

## 2023-03-03 MED ORDER — POTASSIUM CHLORIDE 20 MEQ PO PACK
60.0000 meq | PACK | Freq: Once | ORAL | Status: DC
  Filled 2023-03-03: qty 3

## 2023-03-03 MED ORDER — METOCLOPRAMIDE HCL 5 MG/ML IJ SOLN
20.0000 mg | Freq: Once | INTRAVENOUS | Status: AC
  Administered 2023-03-03: 20 mg via INTRAVENOUS
  Filled 2023-03-03: qty 4

## 2023-03-03 MED ORDER — SODIUM CHLORIDE 0.9 % IV BOLUS
1000.0000 mL | Freq: Once | INTRAVENOUS | Status: AC
  Administered 2023-03-03: 1000 mL via INTRAVENOUS

## 2023-03-03 MED ORDER — POTASSIUM CHLORIDE CRYS ER 20 MEQ PO TBCR
40.0000 meq | EXTENDED_RELEASE_TABLET | Freq: Once | ORAL | Status: AC
  Administered 2023-03-03: 40 meq via ORAL
  Filled 2023-03-03: qty 2

## 2023-03-03 MED ORDER — DIPHENHYDRAMINE HCL 50 MG/ML IJ SOLN
25.0000 mg | Freq: Once | INTRAMUSCULAR | Status: AC
  Administered 2023-03-03: 25 mg via INTRAVENOUS
  Filled 2023-03-03: qty 1

## 2023-03-03 MED ORDER — KETOROLAC TROMETHAMINE 30 MG/ML IJ SOLN
30.0000 mg | Freq: Once | INTRAMUSCULAR | Status: AC
  Administered 2023-03-03: 30 mg via INTRAVENOUS
  Filled 2023-03-03: qty 1

## 2023-03-03 MED ORDER — POTASSIUM & MAGNESIUM ASPARTAT 250-250 MG PO CAPS: 2.0000 | ORAL_CAPSULE | Freq: Two times a day (BID) | ORAL | 0 refills | Status: AC

## 2023-03-03 MED ORDER — DEXAMETHASONE SODIUM PHOSPHATE 10 MG/ML IJ SOLN
10.0000 mg | Freq: Once | INTRAMUSCULAR | Status: DC
  Filled 2023-03-03: qty 1

## 2023-03-03 MED ORDER — POTASSIUM CHLORIDE 10 MEQ/100ML IV SOLN
10.0000 meq | INTRAVENOUS | Status: DC
  Administered 2023-03-03 (×2): 10 meq via INTRAVENOUS
  Filled 2023-03-03 (×2): qty 100

## 2023-03-03 MED ORDER — ONDANSETRON 8 MG PO TBDP: 8.0000 mg | ORAL_TABLET | Freq: Three times a day (TID) | ORAL | 0 refills | Status: AC | PRN

## 2023-03-03 NOTE — ED Notes (Signed)
  Dr. Vicente Males aware of potassium value of 2.5.

## 2023-03-03 NOTE — ED Notes (Signed)
Per Dr. Vicente Males, IV K+ discontinued by accident. Pt is meant to have 20 mEq total IV K+.

## 2023-03-03 NOTE — ED Triage Notes (Signed)
Pt presents to ED from home C/O headache X 2 days.  + photophobia, + nausea/vomiting

## 2023-03-03 NOTE — ED Provider Notes (Signed)
[facility] Provider Note   Event Date/Time   First MD Initiated Contact with Patient 03/03/23 1456     (approximate) History  Headache  HPI Olivia Walter is a 37 y.o. female with a stated past medical history of kidney stones, GERD, migraine, bipolar disorder, asthma, reactive hypoglycemia who presents complaining of headache over the last 24 hours with associated nausea/vomiting.  Patient also states she is having bilateral lower extremity stiffness as well as the feeling of shaking that she has throughout her body.  Patient states that the symptoms are similar to when she has been hypokalemic in the past.  Patient also endorses photophobia, phonophobia. ROS: Patient currently denies any vision changes, tinnitus, difficulty speaking, facial droop, sore throat, chest pain, shortness of breath, abdominal pain, diarrhea, dysuria, or weakness/numbness/paresthesias in any extremity   Physical Exam  Triage Vital Signs: ED Triage Vitals  Enc Vitals Group     BP 03/03/23 1445 115/76     Pulse Rate 03/03/23 1445 71     Resp 03/03/23 1445 16     Temp 03/03/23 1445 98.4 F (36.9 C)     Temp Source 03/03/23 1445 Oral     SpO2 03/03/23 1445 98 %     Weight 03/03/23 1446 127 lb (57.6 kg)     Height 03/03/23 1446 5\' 3"  (1.6 m)     Head Circumference --      Peak Flow --      Pain Score 03/03/23 1446 9     Pain Loc --      Pain Edu? --      Excl. in GC? --    Most recent vital signs: Vitals:   03/03/23 1445  BP: 115/76  Pulse: 71  Resp: 16  Temp: 98.4 F (36.9 C)  SpO2: 98%   General: Awake, oriented x4. CV:  Good peripheral perfusion.  Resp:  Normal effort.  Abd:  No distention.  Other:  Middle-aged well-developed, well-nourished Caucasian female laying in bed in mild distress secondary to headache ED Results / Procedures / Treatments  Labs (all labs ordered are listed, but only abnormal results are displayed) Labs Reviewed  COMPREHENSIVE  METABOLIC PANEL - Abnormal; Notable for the following components:      Result Value   Potassium 2.5 (*)    Glucose, Bld 103 (*)    All other components within normal limits  CBC WITH DIFFERENTIAL/PLATELET - Abnormal; Notable for the following components:   WBC 10.6 (*)    Hemoglobin 15.2 (*)    All other components within normal limits   PROCEDURES: Critical Care performed: No Procedures MEDICATIONS ORDERED IN ED: Medications  sodium chloride 0.9 % bolus 1,000 mL (0 mLs Intravenous Stopped 03/03/23 1711)  ketorolac (TORADOL) 30 MG/ML injection 30 mg (30 mg Intravenous Given 03/03/23 1610)  metoCLOPramide (REGLAN) 20 mg in dextrose 5 % 50 mL IVPB (0 mg Intravenous Stopped 03/03/23 1635)  diphenhydrAMINE (BENADRYL) injection 25 mg (25 mg Intravenous Given 03/03/23 1610)  magnesium sulfate IVPB 2 g 50 mL (0 g Intravenous Stopped 03/03/23 1711)  potassium chloride SA (KLOR-CON M) CR tablet 40 mEq (40 mEq Oral Given 03/03/23 1822)   IMPRESSION / MDM / ASSESSMENT AND PLAN / ED COURSE  I reviewed the triage vital signs and the nursing notes.                             The patient is on the  cardiac monitor to evaluate for evidence of arrhythmia and/or significant heart rate changes. Patient's presentation is most consistent with acute presentation with potential threat to life or bodily function. Presents with Headache.  No focal neurological symptoms. Neuro exam is benign. Pt is nontoxic. VSS.  Based on history and normal neurological exam I have low suspicion for intracranial tumor, intracranial bleed, meningitis, temporal arteritis, glaucoma, CO poisoning. Patient does show signs of significant hypokalemia to 2.5 which may be contributing to patient's recurrent migraines.  Patient was given the start of potassium replacement here via IV as well as p.o. with instructions to continue supplementation as an outpatient. Most likely patient has benign headache, recommend rest, hydration, and  ibuprofen.  Disposition: Discharge home with strict return precautions and instructions for prompt primary care follow up.   FINAL CLINICAL IMPRESSION(S) / ED DIAGNOSES   Final diagnoses:  Migraine without status migrainosus, not intractable, unspecified migraine type  Hypokalemia   Rx / DC Orders   ED Discharge Orders          Ordered    Mag Aspart-Potassium Aspart (POTASSIUM & MAGNESIUM ASPARTAT) 250-250 MG CAPS  2 times daily        03/03/23 1902    ondansetron (ZOFRAN-ODT) 8 MG disintegrating tablet  Every 8 hours PRN        03/03/23 1904           Note:  This document was prepared using Dragon voice recognition software and may include unintentional dictation errors.   Merwyn Katos, MD 03/03/23 [phone]

## 2023-03-06 ENCOUNTER — Emergency Department: Payer: BLUE CROSS/BLUE SHIELD

## 2023-03-06 ENCOUNTER — Other Ambulatory Visit: Payer: Self-pay

## 2023-03-06 ENCOUNTER — Ambulatory Visit: Payer: Self-pay | Admitting: *Deleted

## 2023-03-06 ENCOUNTER — Encounter: Payer: Self-pay | Admitting: Intensive Care

## 2023-03-06 ENCOUNTER — Observation Stay
Admission: EM | Admit: 2023-03-06 | Discharge: 2023-03-06 | Discharge status: Against Medical Advice | Payer: BLUE CROSS/BLUE SHIELD | Attending: Internal Medicine | Admitting: Internal Medicine

## 2023-03-06 DIAGNOSIS — R000 Tachycardia, unspecified: Secondary | ICD-10-CM | POA: Diagnosis present

## 2023-03-06 DIAGNOSIS — E876 Hypokalemia: Secondary | ICD-10-CM | POA: Diagnosis not present

## 2023-03-06 DIAGNOSIS — F17210 Nicotine dependence, cigarettes, uncomplicated: Secondary | ICD-10-CM | POA: Insufficient documentation

## 2023-03-06 DIAGNOSIS — Z7982 Long term (current) use of aspirin: Secondary | ICD-10-CM | POA: Diagnosis not present

## 2023-03-06 DIAGNOSIS — J45909 Unspecified asthma, uncomplicated: Secondary | ICD-10-CM | POA: Insufficient documentation

## 2023-03-06 DIAGNOSIS — Z79899 Other long term (current) drug therapy: Secondary | ICD-10-CM | POA: Diagnosis not present

## 2023-03-06 DIAGNOSIS — E860 Dehydration: Secondary | ICD-10-CM | POA: Insufficient documentation

## 2023-03-06 DIAGNOSIS — N83201 Unspecified ovarian cyst, right side: Secondary | ICD-10-CM | POA: Insufficient documentation

## 2023-03-06 DIAGNOSIS — K529 Noninfective gastroenteritis and colitis, unspecified: Principal | ICD-10-CM | POA: Diagnosis present

## 2023-03-06 LAB — BASIC METABOLIC PANEL
Anion gap: 12 (ref 5–15)
BUN: 9 mg/dL (ref 6–20)
CO2: 25 mmol/L (ref 22–32)
Calcium: 8.8 mg/dL — ABNORMAL LOW (ref 8.9–10.3)
Chloride: 99 mmol/L (ref 98–111)
Creatinine, Ser: 0.71 mg/dL (ref 0.44–1.00)
GFR, Estimated: >60 mL/min (ref >60)
Glucose, Bld: 87 mg/dL (ref 70–99)
Potassium: 2.5 mmol/L — CL (ref 3.5–5.1)
Sodium: 136 mmol/L (ref 135–145)

## 2023-03-06 LAB — URINALYSIS, ROUTINE W REFLEX MICROSCOPIC
Bilirubin Urine: NEGATIVE
Glucose, UA: NEGATIVE mg/dL
Ketones, ur: 20 mg/dL — AB
Leukocytes,Ua: NEGATIVE
Nitrite: NEGATIVE
Protein, ur: NEGATIVE mg/dL
Specific Gravity, Urine: 1.002 — ABNORMAL LOW (ref 1.005–1.030)
pH: 6 (ref 5.0–8.0)

## 2023-03-06 LAB — CBC
HCT: 42.5 % (ref 36.0–46.0)
Hemoglobin: 15.1 g/dL — ABNORMAL HIGH (ref 12.0–15.0)
MCH: 31 pg (ref 26.0–34.0)
MCHC: 35.5 g/dL (ref 30.0–36.0)
MCV: 87.3 fL (ref 80.0–100.0)
Platelets: 359 10*3/uL (ref 150–400)
RBC: 4.87 MIL/uL (ref 3.87–5.11)
RDW: 12.8 % (ref 11.5–15.5)
WBC: 10.9 10*3/uL — ABNORMAL HIGH (ref 4.0–10.5)
nRBC: 0 % (ref 0.0–0.2)

## 2023-03-06 LAB — TROPONIN I (HIGH SENSITIVITY)
Troponin I (High Sensitivity): 3 ng/L (ref <18)
Troponin I (High Sensitivity): 4 ng/L (ref <18)

## 2023-03-06 LAB — MAGNESIUM: Magnesium: 1.7 mg/dL (ref 1.7–2.4)

## 2023-03-06 LAB — PHOSPHORUS: Phosphorus: 3.7 mg/dL (ref 2.5–4.6)

## 2023-03-06 MED ORDER — DROPERIDOL 2.5 MG/ML IJ SOLN
1.2500 mg | Freq: Once | INTRAMUSCULAR | Status: AC
  Administered 2023-03-06: 1.25 mg via INTRAVENOUS
  Filled 2023-03-06: qty 2

## 2023-03-06 MED ORDER — HYDROCODONE-ACETAMINOPHEN 5-325 MG PO TABS: 1.0000 | ORAL_TABLET | Freq: Four times a day (QID) | ORAL | Status: DC | PRN

## 2023-03-06 MED ORDER — ACETAMINOPHEN 325 MG PO TABS: 650.0000 mg | ORAL_TABLET | Freq: Four times a day (QID) | ORAL | Status: DC | PRN

## 2023-03-06 MED ORDER — LACTATED RINGERS IV SOLN: INTRAVENOUS | Status: DC

## 2023-03-06 MED ORDER — LACTATED RINGERS IV BOLUS
1000.0000 mL | Freq: Once | INTRAVENOUS | Status: AC
  Administered 2023-03-06: 1000 mL via INTRAVENOUS

## 2023-03-06 MED ORDER — POTASSIUM CHLORIDE 10 MEQ/100ML IV SOLN
10.0000 meq | INTRAVENOUS | Status: DC
  Administered 2023-03-06: 10 meq via INTRAVENOUS
  Filled 2023-03-06: qty 100

## 2023-03-06 MED ORDER — MAGNESIUM SULFATE 2 GM/50ML IV SOLN
2.0000 g | Freq: Once | INTRAVENOUS | Status: DC
  Filled 2023-03-06: qty 50

## 2023-03-06 MED ORDER — ONDANSETRON HCL 4 MG PO TABS: 4.0000 mg | ORAL_TABLET | Freq: Four times a day (QID) | ORAL | Status: DC | PRN

## 2023-03-06 MED ORDER — ACETAMINOPHEN 325 MG RE SUPP: 650.0000 mg | Freq: Four times a day (QID) | RECTAL | Status: DC | PRN

## 2023-03-06 MED ORDER — HYDROXYZINE HCL 25 MG PO TABS
25.0000 mg | ORAL_TABLET | Freq: Three times a day (TID) | ORAL | Status: DC | PRN
  Filled 2023-03-06: qty 1

## 2023-03-06 MED ORDER — ONDANSETRON HCL 4 MG/2ML IJ SOLN: 4.0000 mg | Freq: Four times a day (QID) | INTRAMUSCULAR | Status: DC | PRN

## 2023-03-06 MED ORDER — POTASSIUM CHLORIDE 10 MEQ/100ML IV SOLN
10.0000 meq | INTRAVENOUS | Status: DC
  Filled 2023-03-06: qty 100

## 2023-03-06 MED ORDER — ENOXAPARIN SODIUM 40 MG/0.4ML IJ SOSY: 40.0000 mg | PREFILLED_SYRINGE | INTRAMUSCULAR | Status: DC

## 2023-03-06 MED ORDER — IOHEXOL 300 MG/ML  SOLN
100.0000 mL | Freq: Once | INTRAMUSCULAR | Status: AC | PRN
  Administered 2023-03-06: 100 mL via INTRAVENOUS

## 2023-03-06 MED ORDER — POTASSIUM CHLORIDE CRYS ER 20 MEQ PO TBCR
40.0000 meq | EXTENDED_RELEASE_TABLET | Freq: Once | ORAL | Status: DC
  Filled 2023-03-06: qty 2

## 2023-03-06 NOTE — ED Notes (Signed)
At this time upon coming into room to give pt antianxiety meds and start Mag, Potassium and LR bolus. Pt refusing to get any medications whatsoever and wanting IV taken out. Asked Charge Sam, RN to come in and help with situation. Patient stating she does not want anything more meds despite needing them and this situation is too much for her and wants to leave AMA. This RN messaged admitting MD Huel Cote and informed her of the situation and asked if she would be able to come speak to patient and she stated she could be here in 20 minutes and if she cannot wait that long she can sign out AMA. Pt stated her wishes were to sign consent form, get IV taken out, no vitals and leave. GF on phone and asked patient to wait to talk to MD because she would not be able to be here for 20 minutes to pick her up anyways but patient still refusing.

## 2023-03-06 NOTE — Assessment & Plan Note (Signed)
History of chronic hypokalemia acutely worsened in the setting of vomiting and diarrhea.  Unable to tolerate oral replacement at this time.  - IV potassium replacement 60 mEq - Repeat BMP in the a.m.

## 2023-03-06 NOTE — ED Provider Notes (Signed)
[facility] Provider Note    Event Date/Time   First MD Initiated Contact with Patient 03/06/23 1050     (approximate)   History   Tachycardia   HPI  Olivia Walter is a 37 y.o. female here with recurrent weakness.  The patient states that over the last week to 2 weeks, she has had progressively worsening nausea, vomiting, diarrhea, and inability to tolerate p.o.  She was just here several days ago and had significant hypokalemia which was repleted.  Since then, she has been essentially unable to keep anything down.  She reports a history of ulcerative colitis but states she is not on anything because steroids made her worse.  Denies any fevers.  She has had some mild intermittent abdominal cramping but this not severe.  No fevers.     Physical Exam   Triage Vital Signs: ED Triage Vitals  Enc Vitals Group     BP 03/06/23 1043 118/77     Pulse Rate 03/06/23 1043 78     Resp 03/06/23 1043 16     Temp 03/06/23 1043 97.7 F (36.5 C)     Temp Source 03/06/23 1040 Oral     SpO2 03/06/23 1043 96 %     Weight 03/06/23 1041 123 lb (55.8 kg)     Height 03/06/23 1041 5\' 3"  (1.6 m)     Head Circumference --      Peak Flow --      Pain Score 03/06/23 1041 2     Pain Loc --      Pain Edu? --      Excl. in GC? --     Most recent vital signs: Vitals:   03/06/23 1300 03/06/23 1322  BP: 111/61   Pulse: 86   Resp: 17   Temp:    SpO2: 100% 100%     General: Awake, no distress.  CV:  Good peripheral perfusion.  Mild tachycardia. Resp:  Normal work of breathing.  Regular rate and rhythm. Abd:  No distention.  Minimal diffuse tenderness without rebound or guarding. Other:  Markedly dry mucous membranes.   ED Results / Procedures / Treatments   Labs (all labs ordered are listed, but only abnormal results are displayed) Labs Reviewed  BASIC METABOLIC PANEL - Abnormal; Notable for the following components:      Result Value   Potassium 2.5 (*)     Calcium 8.8 (*)    All other components within normal limits  CBC - Abnormal; Notable for the following components:   WBC 10.9 (*)    Hemoglobin 15.1 (*)    All other components within normal limits  URINALYSIS, ROUTINE W REFLEX MICROSCOPIC - Abnormal; Notable for the following components:   Color, Urine STRAW (*)    APPearance CLEAR (*)    Specific Gravity, Urine 1.002 (*)    Hgb urine dipstick SMALL (*)    Ketones, ur 20 (*)    Bacteria, UA RARE (*)    All other components within normal limits  GASTROINTESTINAL PANEL BY PCR, STOOL (REPLACES STOOL CULTURE)  CALPROTECTIN, FECAL  MAGNESIUM  PHOSPHORUS  LACTOFERRIN, FECAL, QUALITATIVE  TROPONIN I (HIGH SENSITIVITY)  TROPONIN I (HIGH SENSITIVITY)     EKG Sinus rhythm, ventricular at 72.  PR 155, QRS 87, QTc 454.  No acute ST elevations or depressions.  Acute evidence of acute ischemia or infarct.   RADIOLOGY CT abdomen/pelvis: Mild diffuse colitis, otherwise unremarkable Chest x-ray: Clear   I also  independently reviewed and agree with radiologist interpretations.   PROCEDURES:  Critical Care performed: Yes, see critical care procedure note(s)  .Critical Care  Performed by: Shaune Pollack, MD Authorized by: Shaune Pollack, MD   Critical care provider statement:    Critical care time (minutes):  30   Critical care time was exclusive of:  Separately billable procedures and treating other patients   Critical care was necessary to treat or prevent imminent or life-threatening deterioration of the following conditions:  Cardiac failure, circulatory failure and respiratory failure   Critical care was time spent personally by me on the following activities:  Development of treatment plan with patient or surrogate, discussions with consultants, evaluation of patient's response to treatment, examination of patient, ordering and review of laboratory studies, ordering and review of radiographic studies, ordering and performing  treatments and interventions, pulse oximetry, re-evaluation of patient's condition and review of old charts     MEDICATIONS ORDERED IN ED: Medications  magnesium sulfate IVPB 2 g 50 mL (0 g Intravenous Hold 03/06/23 1330)  lactated ringers infusion ( Intravenous Patient Refused/Not Given 03/06/23 1518)  enoxaparin (LOVENOX) injection 40 mg (has no administration in time range)  acetaminophen (TYLENOL) tablet 650 mg (has no administration in time range)    Or  acetaminophen (TYLENOL) suppository 650 mg (has no administration in time range)  ondansetron (ZOFRAN) tablet 4 mg (has no administration in time range)    Or  ondansetron (ZOFRAN) injection 4 mg (has no administration in time range)  HYDROcodone-acetaminophen (NORCO/VICODIN) 5-325 MG per tablet 1 tablet (has no administration in time range)  potassium chloride 10 mEq in 100 mL IVPB (has no administration in time range)  hydrOXYzine (ATARAX) tablet 25 mg (has no administration in time range)  lactated ringers bolus 1,000 mL (0 mLs Intravenous Stopped 03/06/23 1502)  lactated ringers bolus 1,000 mL (0 mLs Intravenous Stopped 03/06/23 1328)  droperidol (INAPSINE) 2.5 MG/ML injection 1.25 mg (1.25 mg Intravenous Given 03/06/23 1122)  iohexol (OMNIPAQUE) 300 MG/ML solution 100 mL (100 mLs Intravenous Contrast Given 03/06/23 1211)     IMPRESSION / MDM / ASSESSMENT AND PLAN / ED COURSE  I reviewed the triage vital signs and the nursing notes.                              Differential diagnosis includes, but is not limited to, ongoing colitis, dehydration, hypokalemia, AKI, UC, food-borne illness  Patient's presentation is most consistent with acute presentation with potential threat to life or bodily function.  The patient is on the cardiac monitor to evaluate for evidence of arrhythmia and/or significant heart rate changes  37 yo F here with nausea, vomiting, diarrhea. Pt markedly dehydrated on arrival. Labs show significant recurrent  hypokalemia likely from her poor PO intake and diarrheal illness. No evidence of sepsis. CT scan does show mild colitis. Pt will be given IVF, IV repletion, and admit to medicine.    FINAL CLINICAL IMPRESSION(S) / ED DIAGNOSES   Final diagnoses:  Hypokalemia  Dehydration     Rx / DC Orders   ED Discharge Orders     None        Note:  This document was prepared using Dragon voice recognition software and may include unintentional dictation errors.   Shaune Pollack, MD 03/06/23 [phone]

## 2023-03-06 NOTE — ED Notes (Addendum)
Patient signed AMA form at this time. This RN read on the screen the information on the AMA form if deciding to leave AMA. This RN had patient sign consent form and this RN was a witness. MD aware. IV also taken out at this time. Charge nurse informed.

## 2023-03-06 NOTE — ED Triage Notes (Signed)
Patient reports feeling like her heart is beating fast. C/o hand and thigh cramping.   Was seen recently in ER and treated for headache and low potassium.

## 2023-03-06 NOTE — ED Notes (Signed)
Pt to ED for diarrhea the past few days, unable to eat the past few days, states was seen recently for same and given potassium and magnesium. Was sent home with px for same but was unable to get from pharmacy. States she is not eating d/t diarrhea and scared to throw up.  Also reports here d/t chest pain that started at 0400 today.  RR even and unlabored. NAD noted.

## 2023-03-06 NOTE — H&P (Signed)
History and Physical    Patient: Olivia Walter [id number] DOB: 09/01/1986 DOA: 03/06/2023 DOS: the patient was seen and examined on 03/06/2023 PCP: Patient, No Pcp Per  Patient coming from: Home  Chief Complaint:  Chief Complaint  Patient presents with   Tachycardia   HPI: Olivia Walter is a 37 y.o. female with medical history significant of nephrolithiasis, GERD, bipolar disorder, who presents to the ED due to nausea, vomiting and diarrhea.  Olivia Walter states that on 4/1, she had sudden onset nausea with vomiting but did not feel badly at that time.  However she subsequently began to experience persistent nausea with vomiting in addition to diarrhea.  Since then, episodes of vomiting and diarrhea have occurred daily.  She notes that at the time her symptoms started, her stool was black on a few occasions, but has been brown/yellow for the last several days.  She had 4 episodes of diarrhea yesterday.  She endorses left lower quadrant abdominal pain and poor p.o. intake.  She endorses hot and cold chills but is uncertain about fever.  She endorses palpitations and dizziness over the last few days.  She denies any shortness of breath or chest pain at this time.  ED course: On arrival to the ED, patient was normotensive at 118/77 with heart rate of 79.  She was saturating at 96% on room air.  She was afebrile at 97.7.  Initial workup notable for WBC of 10.9, hemoglobin of 15.1, sodium 136, potassium 2.5, creatinine 0.71, magnesium 1.7, and GFR above 60.  Urinalysis was obtained that demonstrated hematuria, ketonuria, and rare bacteria.  CT of the abdomen was obtained that demonstrated mild colitis most prominent in the rectosigmoid region.  Patient started on IV fluids and electrolyte replacement.  TRH contacted for admission.  Review of Systems: As mentioned in the history of present illness. All other systems reviewed and are negative.  Past Medical History:  Diagnosis Date    Asthma    childhood asthma   Bipolar disorder    GERD (gastroesophageal reflux disease)    Headache    Migraines   History of kidney stones    History of stomach ulcers    Kidney calculus    Kidney stones    Osteoarthritis    Reactive hypoglycemia    Past Surgical History:  Procedure Laterality Date   ABDOMINAL HYSTERECTOMY     CAPSULE ENDOSCOPY     COLONOSCOPY     cyst removed on wrist     CYSTOSCOPY N/A 12/02/2017   Procedure: CYSTOSCOPY;  Surgeon: Christeen DouglasBeasley, Bethany, MD;  Location: [facility];  Service: Gynecology;  Laterality: N/A;   ESOPHAGOGASTRODUODENOSCOPY     ESOPHAGOGASTRODUODENOSCOPY (EGD) WITH PROPOFOL N/A 10/01/2016   Procedure: ESOPHAGOGASTRODUODENOSCOPY (EGD) WITH PROPOFOL;  Surgeon: Christena DeemMartin U Skulskie, MD;  [facility];  Service: Endoscopy;  Laterality: N/A;   LAPAROSCOPIC BILATERAL SALPINGECTOMY Bilateral 12/02/2017   Procedure: LAPAROSCOPIC BILATERAL SALPINGECTOMY;  Surgeon: Christeen DouglasBeasley, Bethany, MD;  Location: [facility] OR[facility];  Service: Gynecology;  Laterality: Bilateral;   LAPAROSCOPIC HYSTERECTOMY N/A 12/02/2017   Procedure: HYSTERECTOMY TOTAL LAPAROSCOPIC;  Surgeon: Christeen DouglasBeasley, Bethany, MD;  Location: [facility];  Service: Gynecology;  Laterality: N/A;   MOUTH SURGERY     Social History:  reports that she has been smoking cigarettes. She has been smoking an average of 1 pack per day. She has never used smokeless tobacco. She reports that she does not drink alcohol and does not use drugs.  Allergies  Allergen Reactions   Bee Venom  Anaphylaxis   Penicillins Anaphylaxis    Has patient had a PCN reaction causing immediate rash, facial/tongue/throat swelling, SOB or lightheadedness with hypotension: Yes Has patient had a PCN reaction causing severe rash involving mucus membranes or skin necrosis: No Has patient had a PCN reaction that required hospitalization: Unknown Has patient had a PCN reaction occurring within the last 10 years: No If all of the above answers are "NO",  then may proceed with Cephalosporin use.    Bactrim [Sulfamethoxazole-Trimethoprim] Other (See Comments)    migraine   Clindamycin/Lincomycin Nausea And Vomiting   Tape Dermatitis    Family History  Problem Relation Age of Onset   Diabetes Mother    Congestive Heart Failure Mother    Hypertension Mother    Hypertension Father    Prostate cancer Neg Hx    Kidney cancer Neg Hx    Bladder Cancer Neg Hx     Prior to Admission medications   Medication Sig Start Date End Date Taking? Authorizing Provider  acetaminophen (TYLENOL) 500 MG tablet Take 1,000 mg by mouth every 6 (six) hours as needed for fever.    [provider]  aspirin-acetaminophen-caffeine (EXCEDRIN MIGRAINE) [phone] MG tablet Take 2 tablets by mouth every 6 (six) hours as needed for headache.     [provider]  cyclobenzaprine (FLEXERIL) 5 MG tablet Take 1-2 tablets (5-10 mg total) by mouth 3 (three) times daily as needed for muscle spasms. Patient not taking: Reported on 09/13/2020 04/28/20   Evon Slack, PA-C  Mag Aspart-Potassium Aspart (POTASSIUM & MAGNESIUM ASPARTAT) 250-250 MG CAPS Take 2 tablets by mouth 2 (two) times daily. 03/03/23   Merwyn Katos, MD  medroxyPROGESTERone (DEPO-PROVERA) 150 MG/ML injection Inject 150 mg into the muscle every 3 (three) months. 07/01/20   [provider]  naproxen (NAPROSYN) 500 MG tablet Take 1 tablet (500 mg total) by mouth 2 (two) times daily with a meal. Patient not taking: Reported on 09/13/2020 10/01/19   Jene Every, MD  omeprazole (PRILOSEC) 40 MG capsule Take 40 mg by mouth once. 06/16/20   [provider]  ondansetron (ZOFRAN-ODT) 8 MG disintegrating tablet Take 1 tablet (8 mg total) by mouth every 8 (eight) hours as needed for nausea or vomiting. 03/03/23   Merwyn Katos, MD  potassium chloride SA (KLOR-CON) 20 MEQ tablet Take 1 tablet (20 mEq total) by mouth 2 (two) times daily for 5 days. 05/24/21 05/29/21  Orvil Feil, PA-C   SUMAtriptan (IMITREX) 50 MG tablet Take 50 mg by mouth every 2 (two) hours as needed for migraine. May repeat in 2 hours if headache persists or recurs.    [provider]    Physical Exam: Vitals:   03/06/23 1130 03/06/23 1200 03/06/23 1300 03/06/23 1322  BP: 104/73 112/72 111/61   Pulse: 71 74 86   Resp: 18 13 17    Temp:      TempSrc:      SpO2: 100% 100% 100% 100%  Weight:      Height:       Physical Exam Vitals and nursing note reviewed.  Constitutional:      Appearance: She is normal weight. She is not toxic-appearing.  HENT:     Head: Normocephalic and atraumatic.     Mouth/Throat:     Mouth: Mucous membranes are dry.     Pharynx: Oropharynx is clear.  Cardiovascular:     Rate and Rhythm: Normal rate and regular rhythm.     Heart sounds:  No murmur heard.    No gallop.  Pulmonary:     Effort: Pulmonary effort is normal. No respiratory distress.     Breath sounds: Normal breath sounds. No wheezing, rhonchi or rales.  Abdominal:     General: Bowel sounds are normal. There is no distension.     Palpations: Abdomen is soft.     Tenderness: There is abdominal tenderness (LLQ). There is no guarding.  Musculoskeletal:     Cervical back: Neck supple.     Right lower leg: No edema.     Left lower leg: No edema.  Skin:    General: Skin is warm and dry.  Neurological:     General: No focal deficit present.     Mental Status: She is alert and oriented to person, place, and time.  Psychiatric:        Mood and Affect: Mood is anxious.    Data Reviewed: CBC with WBC of 10.9, hemoglobin 15.1, platelets 359 BMP with sodium of 136, potassium 2.5, bicarb 25, glucose 87, BUN 9, creatinine 0.71 with GFR above 60 Magnesium within normal limits at 1.7 Phosphorus within normal limits at 3.7 Troponin negative x 2 Urinalysis with small hemoglobin, ketonuria, and rare bacteria.  EKG personally reviewed.  Sinus rhythm with rate of 72.  No ST or T wave changes concerning  for acute ischemia.  CT ABDOMEN PELVIS W CONTRAST  Result Date: 03/06/2023 CLINICAL DATA:  Acute abdominal pain. EXAM: CT ABDOMEN AND PELVIS WITH CONTRAST TECHNIQUE: Multidetector CT imaging of the abdomen and pelvis was performed using the standard protocol following bolus administration of intravenous contrast. RADIATION DOSE REDUCTION: This exam was performed according to the departmental dose-optimization program which includes automated exposure control, adjustment of the mA and/or kV according to patient size and/or use of iterative reconstruction technique. CONTRAST:  OMNIPAQUE IOHEXOL 300 MG/ML  SOLN COMPARISON:  08/12/2020 FINDINGS: Lower chest: No acute abnormality. Hepatobiliary: No focal liver abnormality is seen. No gallstones, gallbladder wall thickening, or biliary dilatation. Pancreas: Unremarkable. No pancreatic ductal dilatation or surrounding inflammatory changes. Spleen: Normal in size without focal abnormality. Adrenals/Urinary Tract: Adrenal glands are unremarkable. Kidneys are normal, without renal calculi, focal lesion, or hydronephrosis. Bladder is unremarkable. Stomach/Bowel: Stomach is within normal limits. No bowel distention to suggest bowel obstruction. No pneumatosis, pneumoperitoneum or portal venous gas. Mild relative colonic bowel wall thickening most prominent involving the rectosigmoid colon concerning for mild colitis secondary to an infectious or inflammatory etiology. Mild fatty infiltration of the cecal wall and ascending colon as can be seen with chronic inflammation. Vascular/Lymphatic: No significant vascular findings are present. No enlarged abdominal or pelvic lymph nodes. Reproductive: Prior hysterectomy. Other: No abdominal wall hernia or abnormality. No abdominopelvic ascites. Musculoskeletal: No acute osseous abnormality. No aggressive osseous lesion. IMPRESSION: 1. Mild relative colonic bowel wall thickening most prominent involving the rectosigmoid colon  concerning for mild colitis secondary to an infectious or inflammatory etiology. Electronically Signed   By: Elige Ko M.D.   On: 03/06/2023 12:39   DG Chest 2 View  Result Date: 03/06/2023 CLINICAL DATA:  tachycardia EXAM: CHEST - 2 VIEW COMPARISON:  05/24/2021 FINDINGS: Cardiac silhouette is unremarkable. No pneumothorax or pleural effusion. The lungs are clear. The visualized skeletal structures are unremarkable. IMPRESSION: No acute cardiopulmonary process. Electronically Signed   By: Layla Maw M.D.   On: 03/06/2023 11:05    There are no new results to review at this time.  Assessment and Plan:  * Acute colitis Patient  is presenting with approximately 10-day history of persistent and intractable nausea, vomiting, diarrhea and left lower quadrant abdominal pain.  CT imaging obtained today demonstrates mild colitis predominantly in the rectosigmoid region.  Patient reports a history of ulcerative colitis, however previous GI notes do not mention this.  Likely infectious colitis at this time.  - GI PCR panel ordered - Fecal calprotectin and lactoferrin - Continue IV fluids - Zofran as needed for nausea - Electrolyte management as noted below  Hypokalemia History of chronic hypokalemia acutely worsened in the setting of vomiting and diarrhea.  Unable to tolerate oral replacement at this time.  - IV potassium replacement 60 mEq - Repeat BMP in the a.m.  Advance Care Planning:   Code Status: Full Code   Consults: None  Family Communication: No family at bedside.   Severity of Illness: The appropriate patient status for this patient is OBSERVATION. Observation status is judged to be reasonable and necessary in order to provide the required intensity of service to ensure the patient's safety. The patient's presenting symptoms, physical exam findings, and initial radiographic and laboratory data in the context of their medical condition is felt to place them at decreased risk for  further clinical deterioration. Furthermore, it is anticipated that the patient will be medically stable for discharge from the hospital within 2 midnights of admission.   Author: Verdene Lennert, MD 03/06/2023 2:33 PM  For on call review [email or web address].

## 2023-03-06 NOTE — Telephone Encounter (Addendum)
  Chief Complaint: pounding heart  Symptoms: pounding heart, dizziness, lightheaded Frequency: started early am Pertinent Negatives: Patient denies chest pain, BP 98/71 P 83 Disposition: [x] ED /[] Urgent Care (no appt availability in office) / [] Appointment(In office/virtual)/ []  [place] [facility]/ [] Home Care/ [] Refused Recommended Disposition /[] [place] [facility]/ []  Follow-up with PCP Additional Notes: Patient advised ED for evaluation

## 2023-03-06 NOTE — Discharge Summary (Signed)
Physician Discharge Summary   Patient: Olivia Walter MRN: [id number] DOB: 03/29/1986  Admit date:     03/06/2023  Discharge date: 03/06/23  Discharge Physician: Verdene LennertIulia Rilen Shukla   PCP: Patient, No Pcp Per   Recommendations at discharge:   Outpatient follow-up with GI and PCP  Discharge Diagnoses: Principal Problem:   Acute colitis Active Problems:   Hypokalemia  Resolved Problems:   * No resolved hospital problems. [facility]*  Hospital Course: On arrival to the ED, patient was normotensive at 118/77 with heart rate of 79.  She was saturating at 96% on room air.  She was afebrile at 97.7.  Initial workup notable for WBC of 10.9, hemoglobin of 15.1, sodium 136, potassium 2.5, creatinine 0.71, magnesium 1.7, and GFR above 60.  Urinalysis was obtained that demonstrated hematuria, ketonuria, and rare bacteria.  CT of the abdomen was obtained that demonstrated mild colitis most prominent in the rectosigmoid region.  Patient started on IV fluids and electrolyte replacement.  TRH contacted for admission. Before additional treatment or testing could be pursued, patient left AGAINST MEDICAL ADVICE  Assessment and Plan: * Acute colitis Patient is presenting with approximately 10-day history of persistent and intractable nausea, vomiting, diarrhea and left lower quadrant abdominal pain.  CT imaging obtained today demonstrates mild colitis predominantly in the rectosigmoid region.  Patient reports a history of ulcerative colitis, however previous GI notes do not mention this.  Likely infectious colitis at this time.  - GI PCR panel ordered - Fecal calprotectin and lactoferrin - Continue IV fluids - Zofran as needed for nausea - Electrolyte management as noted below  Hypokalemia History of chronic hypokalemia acutely worsened in the setting of vomiting and diarrhea.  Unable to tolerate oral replacement at this time.  - IV potassium replacement 60 mEq - Repeat BMP in the a.m.    Consultants:  None Procedures performed: None  Disposition: Home Diet recommendation:  Clear liquid diet, advance as tolerated  Condition at discharge: improving  The results of significant diagnostics from this hospitalization (including imaging, microbiology, ancillary and laboratory) are listed below for reference.   Imaging Studies: CT ABDOMEN PELVIS W CONTRAST  Result Date: 03/06/2023 CLINICAL DATA:  Acute abdominal pain. EXAM: CT ABDOMEN AND PELVIS WITH CONTRAST TECHNIQUE: Multidetector CT imaging of the abdomen and pelvis was performed using the standard protocol following bolus administration of intravenous contrast. RADIATION DOSE REDUCTION: This exam was performed according to the departmental dose-optimization program which includes automated exposure control, adjustment of the mA and/or kV according to patient size and/or use of iterative reconstruction technique. CONTRAST:  100mL OMNIPAQUE IOHEXOL 300 MG/ML  SOLN COMPARISON:  08/12/2020 FINDINGS: Lower chest: No acute abnormality. Hepatobiliary: No focal liver abnormality is seen. No gallstones, gallbladder wall thickening, or biliary dilatation. Pancreas: Unremarkable. No pancreatic ductal dilatation or surrounding inflammatory changes. Spleen: Normal in size without focal abnormality. Adrenals/Urinary Tract: Adrenal glands are unremarkable. Kidneys are normal, without renal calculi, focal lesion, or hydronephrosis. Bladder is unremarkable. Stomach/Bowel: Stomach is within normal limits. No bowel distention to suggest bowel obstruction. No pneumatosis, pneumoperitoneum or portal venous gas. Mild relative colonic bowel wall thickening most prominent involving the rectosigmoid colon concerning for mild colitis secondary to an infectious or inflammatory etiology. Mild fatty infiltration of the cecal wall and ascending colon as can be seen with chronic inflammation. Vascular/Lymphatic: No significant vascular findings are present. No enlarged abdominal or  pelvic lymph nodes. Reproductive: Prior hysterectomy. Other: No abdominal wall hernia or abnormality. No abdominopelvic ascites. Musculoskeletal: No acute  osseous abnormality. No aggressive osseous lesion. IMPRESSION: 1. Mild relative colonic bowel wall thickening most prominent involving the rectosigmoid colon concerning for mild colitis secondary to an infectious or inflammatory etiology. Electronically Signed   By: Elige Ko M.D.   On: 03/06/2023 12:39   DG Chest 2 View  Result Date: 03/06/2023 CLINICAL DATA:  tachycardia EXAM: CHEST - 2 VIEW COMPARISON:  05/24/2021 FINDINGS: Cardiac silhouette is unremarkable. No pneumothorax or pleural effusion. The lungs are clear. The visualized skeletal structures are unremarkable. IMPRESSION: No acute cardiopulmonary process. Electronically Signed   By: Layla Maw M.D.   On: 03/06/2023 11:05    Microbiology: Results for orders placed or performed during the hospital encounter of 05/24/21  Resp Panel by RT-PCR (Flu A&B, Covid) Nasopharyngeal Swab     Status: Abnormal   Collection Time: 05/24/21  9:34 PM   Specimen: Nasopharyngeal Swab; Nasopharyngeal(NP) swabs in vial transport medium  Result Value Ref Range Status   SARS Coronavirus 2 by RT PCR POSITIVE (A) NEGATIVE Final    Comment: RESULT CALLED TO, READ BACK BY AND VERIFIED WITH: ROBBIN REGISTER@[email or web address]  05/24/21 RH (NOTE) SARS-CoV-2 target nucleic acids are DETECTED.  The SARS-CoV-2 RNA is generally detectable in upper respiratory specimens during the acute phase of infection. Positive results are indicative of the presence of the identified virus, but do not rule out bacterial infection or co-infection with other pathogens not detected by the test. Clinical correlation with patient history and other diagnostic information is necessary to determine patient infection status. The expected result is Negative.  Fact Sheet for Patients: BloggerCourse.com  Fact Sheet  for Healthcare Providers: SeriousBroker.it  This test is not yet approved or cleared by the [place] FDA and  has been authorized for detection and/or diagnosis of SARS-CoV-2 by FDA under an Emergency Use Authorization (EUA).  This EUA will remain in effect (meaning this test can be u sed) for the duration of  the COVID-19 declaration under Section 564(b)(1) of the Act, 21 U.S.C. section 360bbb-3(b)(1), unless the authorization is terminated or revoked sooner.     Influenza A by PCR NEGATIVE NEGATIVE Final   Influenza B by PCR NEGATIVE NEGATIVE Final    Comment: (NOTE) The Xpert Xpress SARS-CoV-2/FLU/RSV plus assay is intended as an aid in the diagnosis of influenza from Nasopharyngeal swab specimens and should not be used as a sole basis for treatment. Nasal washings and aspirates are unacceptable for Xpert Xpress SARS-CoV-2/FLU/RSV testing.  Fact Sheet for Patients: BloggerCourse.com  Fact Sheet for Healthcare Providers: SeriousBroker.it  This test is not yet approved or cleared by the [place] FDA and has been authorized for detection and/or diagnosis of SARS-CoV-2 by FDA under an Emergency Use Authorization (EUA). This EUA will remain in effect (meaning this test can be used) for the duration of the COVID-19 declaration under Section 564(b)(1) of the Act, 21 U.S.C. section 360bbb-3(b)(1), unless the authorization is terminated or revoked.  Performed at [facility], [address] [place], [place] [address]     Labs: CBC: Recent Labs  Lab 03/03/23 1615 03/06/23 1053  WBC 10.6* 10.9*  NEUTROABS 7.7  --   HGB 15.2* 15.1*  HCT 42.3 42.5  MCV 86.5 87.3  PLT 350 359   Basic Metabolic Panel: Recent Labs  Lab 03/03/23 1615 03/06/23 1053  NA 139 136  K 2.5* 2.5*  CL 99 99  CO2 29 25  GLUCOSE 103* 87  BUN 8 9  CREATININE 0.67 0.71  CALCIUM 8.9 8.8*  MG  --  1.7   PHOS  --  3.7   Liver Function Tests: Recent Labs  Lab 03/03/23 1615  AST 15  ALT 14  ALKPHOS 58  BILITOT 1.0  PROT 7.2  ALBUMIN 4.2   CBG: No results for input(s): "GLUCAP" in the last 168 hours.  Discharge time spent: less than 30 minutes.  Signed: Verdene Lennert, MD [facility] 03/06/2023

## 2023-03-06 NOTE — Telephone Encounter (Signed)
Reason for Disposition  Dizziness, lightheadedness, or weakness  Answer Assessment - Initial Assessment Questions 1. DESCRIPTION: "Please describe your heart rate or heartbeat that you are having" (e.g., fast/slow, regular/irregular, skipped or extra beats, "palpitations")     hard 2. ONSET: "When did it start?" (Minutes, hours or days)      4 am- has not stopped 3. DURATION: "How long does it last" (e.g., seconds, minutes, hours)     hours 4. PATTERN "Does it come and go, or has it been constant since it started?"  "Does it get worse with exertion?"   "Are you feeling it now?"     constant 5. TAP: "Using your hand, can you tap out what you are feeling on a chair or table in front of you, so that I can hear?" (Note: not all patients can do this)       na 6. HEART RATE: "Can you tell me your heart rate?" "How many beats in 15 seconds?"  (Note: not all patients can do this)       no 7. RECURRENT SYMPTOM: "Have you ever had this before?" If Yes, ask: "When was the last time?" and "What happened that time?"      Hx PVC- never like that 8. CAUSE: "What do you think is causing the palpitations?"     Not sure 9. CARDIAC HISTORY: "Do you have any history of heart disease?" (e.g., heart attack, angina, bypass surgery, angioplasty, arrhythmia)      PVC 10. OTHER SYMPTOMS: "Do you have any other symptoms?" (e.g., dizziness, chest pain, sweating, difficulty breathing)       Dizziness, lightheaded  Protocols used: Heart Rate and Heartbeat Questions-A-AH

## 2023-03-06 NOTE — ED Notes (Signed)
Dr Erma Heritage notified of potassium 2.5. orders to be placed as needed.

## 2023-03-06 NOTE — Assessment & Plan Note (Signed)
Patient is presenting with approximately 10-day history of persistent and intractable nausea, vomiting, diarrhea and left lower quadrant abdominal pain.  CT imaging obtained today demonstrates mild colitis predominantly in the rectosigmoid region.  Patient reports a history of ulcerative colitis, however previous GI notes do not mention this.  Likely infectious colitis at this time.  - GI PCR panel ordered - Fecal calprotectin and lactoferrin - Continue IV fluids - Zofran as needed for nausea - Electrolyte management as noted below

## 2023-12-28 ENCOUNTER — Other Ambulatory Visit: Payer: Self-pay

## 2023-12-28 ENCOUNTER — Emergency Department
Admission: EM | Admit: 2023-12-28 | Discharge: 2023-12-28 | Disposition: A | Discharge status: Home / Self Care | Payer: Self-pay | Attending: Emergency Medicine | Admitting: Emergency Medicine

## 2023-12-28 DIAGNOSIS — E876 Hypokalemia: Secondary | ICD-10-CM | POA: Insufficient documentation

## 2023-12-28 DIAGNOSIS — R197 Diarrhea, unspecified: Secondary | ICD-10-CM | POA: Insufficient documentation

## 2023-12-28 DIAGNOSIS — E860 Dehydration: Secondary | ICD-10-CM | POA: Insufficient documentation

## 2023-12-28 DIAGNOSIS — Z20822 Contact with and (suspected) exposure to covid-19: Secondary | ICD-10-CM | POA: Insufficient documentation

## 2023-12-28 LAB — CBC
HCT: 47.4 % — ABNORMAL HIGH (ref 36.0–46.0)
Hemoglobin: 17 g/dL — ABNORMAL HIGH (ref 12.0–15.0)
MCH: 30.8 pg (ref 26.0–34.0)
MCHC: 35.9 g/dL (ref 30.0–36.0)
MCV: 85.9 fL (ref 80.0–100.0)
Platelets: 203 10*3/uL (ref 150–400)
RBC: 5.52 MIL/uL — ABNORMAL HIGH (ref 3.87–5.11)
RDW: 12.8 % (ref 11.5–15.5)
WBC: 5.6 10*3/uL (ref 4.0–10.5)
nRBC: 0 % (ref 0.0–0.2)

## 2023-12-28 LAB — COMPREHENSIVE METABOLIC PANEL
ALT: 36 U/L (ref 0–44)
AST: 33 U/L (ref 15–41)
Albumin: 4.1 g/dL (ref 3.5–5.0)
Alkaline Phosphatase: 71 U/L (ref 38–126)
Anion gap: 14 (ref 5–15)
BUN: 7 mg/dL (ref 6–20)
CO2: 31 mmol/L (ref 22–32)
Calcium: 8.9 mg/dL (ref 8.9–10.3)
Chloride: 93 mmol/L — ABNORMAL LOW (ref 98–111)
Creatinine, Ser: 0.65 mg/dL (ref 0.44–1.00)
GFR, Estimated: >60 mL/min (ref >60)
Glucose, Bld: 116 mg/dL — ABNORMAL HIGH (ref 70–99)
Potassium: 2.8 mmol/L — ABNORMAL LOW (ref 3.5–5.1)
Sodium: 138 mmol/L (ref 135–145)
Total Bilirubin: 0.8 mg/dL (ref 0.0–1.2)
Total Protein: 7.7 g/dL (ref 6.5–8.1)

## 2023-12-28 LAB — RESP PANEL BY RT-PCR (RSV, FLU A&B, COVID)  RVPGX2
Influenza A by PCR: NEGATIVE
Influenza B by PCR: NEGATIVE
Resp Syncytial Virus by PCR: NEGATIVE
SARS Coronavirus 2 by RT PCR: NEGATIVE

## 2023-12-28 LAB — URINALYSIS, ROUTINE W REFLEX MICROSCOPIC
Bilirubin Urine: NEGATIVE
Glucose, UA: NEGATIVE mg/dL
Ketones, ur: 20 mg/dL — AB
Leukocytes,Ua: NEGATIVE
Nitrite: NEGATIVE
Protein, ur: 100 mg/dL — AB
Specific Gravity, Urine: 1.018 (ref 1.005–1.030)
pH: 6 (ref 5.0–8.0)

## 2023-12-28 LAB — MAGNESIUM: Magnesium: 2.1 mg/dL (ref 1.7–2.4)

## 2023-12-28 LAB — LIPASE, BLOOD: Lipase: 28 U/L (ref 11–51)

## 2023-12-28 MED ORDER — ONDANSETRON 4 MG PO TBDP: 4.0000 mg | ORAL_TABLET | Freq: Three times a day (TID) | ORAL | 0 refills | Status: AC | PRN

## 2023-12-28 MED ORDER — LACTATED RINGERS IV BOLUS
1000.0000 mL | Freq: Once | INTRAVENOUS | Status: AC
  Administered 2023-12-28: 1000 mL via INTRAVENOUS

## 2023-12-28 MED ORDER — POTASSIUM CHLORIDE CRYS ER 20 MEQ PO TBCR
40.0000 meq | EXTENDED_RELEASE_TABLET | Freq: Once | ORAL | Status: AC
  Administered 2023-12-28: 40 meq via ORAL
  Filled 2023-12-28: qty 2

## 2023-12-28 MED ORDER — ONDANSETRON HCL 4 MG/2ML IJ SOLN
4.0000 mg | Freq: Once | INTRAMUSCULAR | Status: AC
  Administered 2023-12-28: 4 mg via INTRAVENOUS
  Filled 2023-12-28: qty 2

## 2023-12-28 NOTE — ED Provider Notes (Signed)
[facility] Provider Note    Event Date/Time   First MD Initiated Contact with Patient 12/28/23 1309     (approximate)   History   Diarrhea   HPI  Morning Halberg is a 38 y.o. female who presents to the ED for evaluation of Diarrhea   Review DC summary from medical admission last year where she was admitted for acute colitis and hypokalemia.  Patient presents to the ED due to concerns for dehydration in the setting of copious diarrhea over the past 3 to 4 days.  Patient reports that she has chronic diarrhea and she never had solid bowel movements, but the past 3 to 4 days has been more severe with increased frequency, volume.   Prior to this, about 1 week ago she reports having fevers that lasted for a few days with diffuse myalgias and malaise and a mild cough that is all improved and she thought that she had the flu.   Physical Exam   Triage Vital Signs: ED Triage Vitals  Encounter Vitals Group     BP 12/28/23 1126 110/85     Systolic BP Percentile --      Diastolic BP Percentile --      Pulse Rate 12/28/23 1126 95     Resp 12/28/23 1126 16     Temp 12/28/23 1126 98 F (36.7 C)     Temp Source 12/28/23 1126 Oral     SpO2 12/28/23 1126 94 %     Weight 12/28/23 1130 124 lb 9.6 oz (56.5 kg)     Height 12/28/23 1130 5\' 3"  (1.6 m)     Head Circumference --      Peak Flow --      Pain Score 12/28/23 1125 0     Pain Loc --      Pain Education --      Exclude from Growth Chart --     Most recent vital signs: Vitals:   12/28/23 1415 12/28/23 1445  BP:    Pulse: 79 84  Resp: 16 (!) 27  Temp:    SpO2: 100% 97%    General: Awake, no distress.  Dry mucous membranes.  Conversational full sentences.  Looks well. CV:  Good peripheral perfusion.  Resp:  Normal effort.  Abd:  No distention.  Soft and benign.  No tenderness. MSK:  No deformity noted.  Neuro:  No focal deficits appreciated. Other:     ED Results / Procedures /  Treatments   Labs (all labs ordered are listed, but only abnormal results are displayed) Labs Reviewed  COMPREHENSIVE METABOLIC PANEL - Abnormal; Notable for the following components:      Result Value   Potassium 2.8 (*)    Chloride 93 (*)    Glucose, Bld 116 (*)    All other components within normal limits  CBC - Abnormal; Notable for the following components:   RBC 5.52 (*)    Hemoglobin 17.0 (*)    HCT 47.4 (*)    All other components within normal limits  URINALYSIS, ROUTINE W REFLEX MICROSCOPIC - Abnormal; Notable for the following components:   Color, Urine AMBER (*)    APPearance HAZY (*)    Hgb urine dipstick MODERATE (*)    Ketones, ur 20 (*)    Protein, ur 100 (*)    Bacteria, UA RARE (*)    All other components within normal limits  RESP PANEL BY RT-PCR (RSV, FLU A&B, COVID)  RVPGX2  C DIFFICILE QUICK SCREEN W PCR REFLEX    GASTROINTESTINAL PANEL BY PCR, STOOL (REPLACES STOOL CULTURE)  LIPASE, BLOOD  MAGNESIUM    EKG   RADIOLOGY   Official radiology report(s): No results found.  PROCEDURES and INTERVENTIONS:  Procedures  Medications  lactated ringers bolus 1,000 mL (0 mLs Intravenous Stopped 12/28/23 1505)  ondansetron (ZOFRAN) injection 4 mg (4 mg Intravenous Given 12/28/23 1325)  potassium chloride SA (KLOR-CON M) CR tablet 40 mEq (40 mEq Oral Given 12/28/23 1334)     IMPRESSION / MDM / ASSESSMENT AND PLAN / ED COURSE  I reviewed the triage vital signs and the nursing notes.  Differential diagnosis includes, but is not limited to, inflammatory bowel disease such as Crohn's or UC, viral syndrome, C. difficile  {Patient presents with symptoms of an acute illness or injury that is potentially life-threatening.  Patient presents with recurrence of severe diarrhea over the past few days.  Had febrile illness last week that does sound possibly viral such as influenza as this is currently in the community.  No fevers concurrent with her diarrhea or  abdominal pain.  Her CBC is normal, metabolic panel with hypokalemia.  Magnesium is normal.  Urine with ketones further suggestive of dehydration but no infectious features.  Negative flu/COVID/RSV testing.  If she has loose stool while she is here we will check for C. difficile and GI pathogen panel.  Without pain, tenderness, leukocytosis or further abnormalities at all feel the need for CT imaging.  Suspect she would benefit from rehydration and possible GI referral.  Clinical Course as of 12/28/23 1517  Sat Dec 28, 2023  1451 Reassessed.  Patient reports feeling much better and is appreciative.  No bowel movement since she has been here.  She is tolerating p.o. intake.  Asking for discharge, which I think is reasonable. [DS]    Clinical Course User Index [DS] Delton Prairie, MD     FINAL CLINICAL IMPRESSION(S) / ED DIAGNOSES   Final diagnoses:  Diarrhea, unspecified type  Dehydration  Hypokalemia     Rx / DC Orders   ED Discharge Orders          Ordered    ondansetron (ZOFRAN-ODT) 4 MG disintegrating tablet  Every 8 hours PRN        12/28/23 1451             Note:  This document was prepared using Dragon voice recognition software and may include unintentional dictation errors.   Delton Prairie, MD 12/28/23 [phone]

## 2023-12-28 NOTE — ED Triage Notes (Signed)
Pt to ED POV for complaints of diarrhea since Wed (3d), 8-9 times per day. States every time she eats or drinks, has diarrhea and has not really eaten anything since 6 days ago because before Wednesday, had fever (102-105 according to tympanic thermometer). Has vomited a few times since Wednesday. Pt states she feels weak and dehydrated. Also generalized body aches. Recent flu exposure at work.   Does not have GB.

## 2023-12-28 NOTE — Discharge Instructions (Addendum)
Please take Tylenol and ibuprofen/Advil for your pain.  It is safe to take them together, or to alternate them every few hours.  Take up to 1000mg of Tylenol at a time, up to 4 times per day.  Do not take more than 4000 mg of Tylenol in 24 hours.  For ibuprofen, take 400-600 mg, 3 - 4 times per day.
